# Patient Record
Sex: Female | Born: 1935 | Race: White | Hispanic: No | Marital: Single | Smoking: Never smoker
Health system: Southern US, Community
[De-identification: ages and names within clinical notes are randomized; demographics above are authoritative.]

## PROBLEM LIST (undated history)

## (undated) DIAGNOSIS — E785 Hyperlipidemia, unspecified: Secondary | ICD-10-CM

## (undated) DIAGNOSIS — I10 Essential (primary) hypertension: Secondary | ICD-10-CM

## (undated) DIAGNOSIS — I129 Hypertensive chronic kidney disease with stage 1 through stage 4 chronic kidney disease, or unspecified chronic kidney disease: Secondary | ICD-10-CM

## (undated) DIAGNOSIS — E039 Hypothyroidism, unspecified: Secondary | ICD-10-CM

## (undated) DIAGNOSIS — M810 Age-related osteoporosis without current pathological fracture: Secondary | ICD-10-CM

## (undated) DIAGNOSIS — I739 Peripheral vascular disease, unspecified: Secondary | ICD-10-CM

## (undated) HISTORY — DX: Hyperlipidemia, unspecified: E78.5

## (undated) HISTORY — PX: CLEFT PALATE REPAIR: SUR1165

## (undated) HISTORY — DX: Hypertensive chronic kidney disease with stage 1 through stage 4 chronic kidney disease, or unspecified chronic kidney disease: I12.9

## (undated) HISTORY — DX: Hypothyroidism, unspecified: E03.9

## (undated) HISTORY — DX: Age-related osteoporosis without current pathological fracture: M81.0

## (undated) HISTORY — DX: Essential (primary) hypertension: I10

## (undated) HISTORY — PX: ABDOMINAL HYSTERECTOMY: SHX81

## (undated) HISTORY — DX: Peripheral vascular disease, unspecified: I73.9

## (undated) NOTE — *Deleted (*Deleted)
Pt via EMS from home. Pt's niece was taking care of her. Pt has had uncontrollable diarrhea since yesterday. Pt denies pain. Pt is alert and oriented only to self. Per family, pt has had a decline for approx a month. NAD at this time.

---

## 2004-03-15 ENCOUNTER — Emergency Department: Payer: Self-pay | Admitting: Unknown Physician Specialty

## 2005-03-03 ENCOUNTER — Ambulatory Visit: Payer: Self-pay | Admitting: Ophthalmology

## 2005-03-10 ENCOUNTER — Ambulatory Visit: Payer: Self-pay | Admitting: Ophthalmology

## 2006-11-16 ENCOUNTER — Ambulatory Visit: Payer: Self-pay | Admitting: Ophthalmology

## 2006-11-16 ENCOUNTER — Other Ambulatory Visit: Payer: Self-pay

## 2006-11-23 ENCOUNTER — Ambulatory Visit: Payer: Self-pay | Admitting: Ophthalmology

## 2007-12-09 ENCOUNTER — Emergency Department: Payer: Self-pay | Admitting: Emergency Medicine

## 2011-05-24 ENCOUNTER — Emergency Department: Payer: Self-pay | Admitting: Emergency Medicine

## 2011-05-26 DIAGNOSIS — J309 Allergic rhinitis, unspecified: Secondary | ICD-10-CM | POA: Diagnosis not present

## 2011-05-26 DIAGNOSIS — S062X0A Diffuse traumatic brain injury without loss of consciousness, initial encounter: Secondary | ICD-10-CM | POA: Diagnosis not present

## 2011-05-26 DIAGNOSIS — I10 Essential (primary) hypertension: Secondary | ICD-10-CM | POA: Diagnosis not present

## 2011-05-26 LAB — HM COLONOSCOPY

## 2011-06-02 LAB — HM MAMMOGRAPHY

## 2012-03-08 DIAGNOSIS — I10 Essential (primary) hypertension: Secondary | ICD-10-CM | POA: Diagnosis not present

## 2012-03-08 DIAGNOSIS — Z23 Encounter for immunization: Secondary | ICD-10-CM | POA: Diagnosis not present

## 2012-03-08 DIAGNOSIS — E039 Hypothyroidism, unspecified: Secondary | ICD-10-CM | POA: Diagnosis not present

## 2012-04-05 DIAGNOSIS — I10 Essential (primary) hypertension: Secondary | ICD-10-CM | POA: Diagnosis not present

## 2012-04-05 DIAGNOSIS — E7800 Pure hypercholesterolemia, unspecified: Secondary | ICD-10-CM | POA: Diagnosis not present

## 2013-04-11 DIAGNOSIS — Z0000 Encounter for general adult medical examination without abnormal findings: Secondary | ICD-10-CM | POA: Diagnosis not present

## 2013-04-11 DIAGNOSIS — N183 Chronic kidney disease, stage 3 unspecified: Secondary | ICD-10-CM | POA: Diagnosis not present

## 2013-04-11 DIAGNOSIS — E785 Hyperlipidemia, unspecified: Secondary | ICD-10-CM | POA: Diagnosis not present

## 2013-05-31 ENCOUNTER — Ambulatory Visit: Payer: Self-pay

## 2013-11-18 DIAGNOSIS — E7800 Pure hypercholesterolemia, unspecified: Secondary | ICD-10-CM | POA: Diagnosis not present

## 2013-11-18 DIAGNOSIS — E039 Hypothyroidism, unspecified: Secondary | ICD-10-CM | POA: Diagnosis not present

## 2013-11-18 DIAGNOSIS — I10 Essential (primary) hypertension: Secondary | ICD-10-CM | POA: Diagnosis not present

## 2013-11-18 DIAGNOSIS — E785 Hyperlipidemia, unspecified: Secondary | ICD-10-CM | POA: Diagnosis not present

## 2014-12-15 ENCOUNTER — Telehealth: Payer: Self-pay | Admitting: Unknown Physician Specialty

## 2014-12-15 NOTE — Telephone Encounter (Signed)
E-fax came through for refill on: Rx: Telmisartan Copy in basket.

## 2014-12-15 NOTE — Telephone Encounter (Signed)
Tried to call patient because patient has not been seen since 11/2013 so they will need an appointment before this will be refilled.

## 2014-12-18 ENCOUNTER — Encounter: Payer: Self-pay | Admitting: Unknown Physician Specialty

## 2014-12-18 ENCOUNTER — Ambulatory Visit (INDEPENDENT_AMBULATORY_CARE_PROVIDER_SITE_OTHER): Payer: 59 | Admitting: Unknown Physician Specialty

## 2014-12-18 VITALS — BP 143/78 | HR 73 | Temp 98.3°F | Ht 62.0 in | Wt 194.4 lb

## 2014-12-18 DIAGNOSIS — I129 Hypertensive chronic kidney disease with stage 1 through stage 4 chronic kidney disease, or unspecified chronic kidney disease: Secondary | ICD-10-CM

## 2014-12-18 DIAGNOSIS — N182 Chronic kidney disease, stage 2 (mild): Secondary | ICD-10-CM

## 2014-12-18 DIAGNOSIS — N183 Chronic kidney disease, stage 3 unspecified: Secondary | ICD-10-CM

## 2014-12-18 DIAGNOSIS — N179 Acute kidney failure, unspecified: Secondary | ICD-10-CM | POA: Insufficient documentation

## 2014-12-18 DIAGNOSIS — E785 Hyperlipidemia, unspecified: Secondary | ICD-10-CM

## 2014-12-18 DIAGNOSIS — N189 Chronic kidney disease, unspecified: Secondary | ICD-10-CM

## 2014-12-18 DIAGNOSIS — N181 Chronic kidney disease, stage 1: Secondary | ICD-10-CM

## 2014-12-18 DIAGNOSIS — N184 Chronic kidney disease, stage 4 (severe): Secondary | ICD-10-CM | POA: Diagnosis not present

## 2014-12-18 DIAGNOSIS — N185 Chronic kidney disease, stage 5: Secondary | ICD-10-CM

## 2014-12-18 DIAGNOSIS — E039 Hypothyroidism, unspecified: Secondary | ICD-10-CM

## 2014-12-18 DIAGNOSIS — I739 Peripheral vascular disease, unspecified: Secondary | ICD-10-CM

## 2014-12-18 DIAGNOSIS — M810 Age-related osteoporosis without current pathological fracture: Secondary | ICD-10-CM

## 2014-12-18 LAB — MICROALBUMIN, URINE WAIVED
Creatinine, Urine Waived: 50 mg/dL (ref 10–300)
MICROALB, UR WAIVED: 10 mg/L (ref 0–19)
Microalb/Creat Ratio: <30 mg/g (ref <30)

## 2014-12-18 LAB — LIPID PANEL PICCOLO, WAIVED
Chol/HDL Ratio Piccolo,Waive: 5.4 mg/dL — ABNORMAL HIGH
Cholesterol Piccolo, Waived: 244 mg/dL — ABNORMAL HIGH (ref <200)
HDL Chol Piccolo, Waived: 45 mg/dL — ABNORMAL LOW (ref >59)
LDL CHOL CALC PICCOLO WAIVED: 174 mg/dL — AB (ref <100)
TRIGLYCERIDES PICCOLO,WAIVED: 124 mg/dL (ref <150)
VLDL CHOL CALC PICCOLO,WAIVE: 25 mg/dL (ref <30)

## 2014-12-18 MED ORDER — TELMISARTAN-HCTZ 80-25 MG PO TABS: 1.0000 | ORAL_TABLET | Freq: Every day | ORAL | Status: DC

## 2014-12-18 MED ORDER — LEVOTHYROXINE SODIUM 100 MCG PO TABS: 100.0000 ug | ORAL_TABLET | Freq: Every day | ORAL | Status: DC

## 2014-12-18 NOTE — Progress Notes (Signed)
BP 143/78 mmHg  Pulse 73  Temp(Src) 98.3 F (36.8 C)  Ht 5\' 2"  (1.575 m)  Wt 194 lb 6.4 oz (88.179 kg)  BMI 35.55 kg/m2  SpO2 98%  LMP  (LMP Unknown)   Subjective:    Patient ID: Kristin Juarez, female    DOB: 1935/12/26, 79 y.o.   MRN: [id number]  HPI: Kristin Juarez is a 79 y.o. female  Chief Complaint  Patient presents with  . Hypertension    pt needs refill on telmisartan  . Hypothyroidism  . Hyperlipidemia  . Constipation    pt states she has been having trouble with constipation for about 2 weeks now   Hypertension This is a chronic problem. The problem is controlled. Pertinent negatives include no anxiety, chest pain, orthopnea, palpitations or shortness of breath. There are no compliance problems.   Hyperlipidemia This is a chronic ("I just can't take any medications for this" because of side-effects.  We tried multiple different kinds) problem. Pertinent negatives include no chest pain or shortness of breath. Current antihyperlipidemic treatment includes diet change. The current treatment provides no improvement of lipids. Compliance problems include adherence to diet (Drinking soft drinks but trying to wean self off).   Constipation This is a new (Up to date on colonoscopy) problem. The current episode started 1 to 4 weeks ago. The patient is not on a high fiber diet. She does not exercise regularly. There has not been adequate water intake. Risk factors: hypothyroid. Treatments tried: Miralax which works.   Hypothyroid Taking thyroid medication but not daily.  States she takes it 3 times a week.  Feels it is too strong as she is too on edge.     Relevant past medical, surgical, family and social history reviewed and updated as indicated. Interim medical history since our last visit reviewed. Allergies and medications reviewed and updated.  Review of Systems  Respiratory: Negative for shortness of breath.   Cardiovascular: Negative for chest pain, palpitations  and orthopnea.  Gastrointestinal: Positive for constipation.    Per HPI unless specifically indicated above     Objective:    BP 143/78 mmHg  Pulse 73  Temp(Src) 98.3 F (36.8 C)  Ht 5\' 2"  (1.575 m)  Wt 194 lb 6.4 oz (88.179 kg)  BMI 35.55 kg/m2  SpO2 98%  LMP  (LMP Unknown)  Wt Readings from Last 3 Encounters:  12/18/14 194 lb 6.4 oz (88.179 kg)  11/18/13 199 lb (90.266 kg)    Physical Exam  Constitutional: She is oriented to person, place, and time. She appears well-developed and well-nourished. No distress.  HENT:  Head: Normocephalic and atraumatic.  Eyes: Conjunctivae and lids are normal. Right eye exhibits no discharge. Left eye exhibits no discharge. No scleral icterus.  Cardiovascular: Normal rate, regular rhythm and normal heart sounds.   Pulmonary/Chest: Effort normal and breath sounds normal. No respiratory distress.  Abdominal: Normal appearance and bowel sounds are normal. She exhibits no distension. There is no splenomegaly or hepatomegaly. There is no tenderness.  Musculoskeletal: Normal range of motion.  Neurological: She is alert and oriented to person, place, and time.  Skin: Skin is warm and intact. No rash noted. No pallor.  Psychiatric: She has a normal mood and affect. Her behavior is normal. Judgment and thought content normal.  Vitals reviewed.     Assessment & Plan:   Problem List Items Addressed This Visit      Unprioritized   Hyperlipidemia    Intolerant to all medication.  Let's recheck after thyroid is managed.        Relevant Medications   telmisartan-hydrochlorothiazide (MICARDIS HCT) 80-25 MG per tablet   Other Relevant Orders   Lipid Panel Piccolo, Waived   Hypertensive CKD (chronic kidney disease) - Primary    Stable.  Continue present medication.  Check CMP      Relevant Orders   Comprehensive metabolic panel   Microalbumin, Urine Waived   Uric acid   CKD (chronic kidney disease), stage III    Check CMP      Relevant  Orders   Comprehensive metabolic panel   Hypothyroidism    Feels dose is too strong and therefore not compliant.  Will restart at 100 mcgs.        Relevant Medications   levothyroxine (SYNTHROID, LEVOTHROID) 100 MCG tablet   Other Relevant Orders   TSH       Follow up plan: Return in about 3 months (around 03/20/2015).

## 2014-12-18 NOTE — Assessment & Plan Note (Signed)
Feels dose is too strong and therefore not compliant.  Will restart at 100 mcgs.

## 2014-12-18 NOTE — Assessment & Plan Note (Signed)
Intolerant to all medication.  Let's recheck after thyroid is managed.

## 2014-12-18 NOTE — Assessment & Plan Note (Signed)
Check CMP.  ?

## 2014-12-18 NOTE — Assessment & Plan Note (Signed)
Stable.  Continue present medication.  Check CMP

## 2014-12-18 NOTE — Telephone Encounter (Signed)
Patient is coming in for an appointment today

## 2014-12-19 LAB — COMPREHENSIVE METABOLIC PANEL
A/G RATIO: 1.5 (ref 1.1–2.5)
ALT: 42 IU/L — ABNORMAL HIGH (ref 0–32)
AST: 37 IU/L (ref 0–40)
Albumin: 4.1 g/dL (ref 3.5–4.8)
Alkaline Phosphatase: 107 IU/L (ref 39–117)
BUN/Creatinine Ratio: 21 (ref 11–26)
BUN: 27 mg/dL (ref 8–27)
Bilirubin Total: 0.5 mg/dL (ref 0.0–1.2)
CALCIUM: 9.8 mg/dL (ref 8.7–10.3)
CO2: 26 mmol/L (ref 18–29)
CREATININE: 1.3 mg/dL — AB (ref 0.57–1.00)
Chloride: 99 mmol/L (ref 97–108)
GFR, EST AFRICAN AMERICAN: 46 mL/min/{1.73_m2} — AB (ref >59)
GFR, EST NON AFRICAN AMERICAN: 40 mL/min/{1.73_m2} — AB (ref >59)
GLOBULIN, TOTAL: 2.7 g/dL (ref 1.5–4.5)
Glucose: 103 mg/dL — ABNORMAL HIGH (ref 65–99)
POTASSIUM: 4.3 mmol/L (ref 3.5–5.2)
SODIUM: 142 mmol/L (ref 134–144)
TOTAL PROTEIN: 6.8 g/dL (ref 6.0–8.5)

## 2014-12-19 LAB — URIC ACID: URIC ACID: 10.6 mg/dL — AB (ref 2.5–7.1)

## 2014-12-19 LAB — TSH: TSH: 8.13 u[IU]/mL — ABNORMAL HIGH (ref 0.450–4.500)

## 2014-12-20 ENCOUNTER — Encounter: Payer: Self-pay | Admitting: Unknown Physician Specialty

## 2014-12-26 ENCOUNTER — Other Ambulatory Visit: Payer: Self-pay | Admitting: Unknown Physician Specialty

## 2014-12-26 DIAGNOSIS — N183 Chronic kidney disease, stage 3 unspecified: Secondary | ICD-10-CM

## 2014-12-27 ENCOUNTER — Telehealth: Payer: Self-pay | Admitting: Unknown Physician Specialty

## 2014-12-27 NOTE — Telephone Encounter (Signed)
Routing to provider. This should have been put in under [employer] instead of [employer]. It is the same patient so I am going to have [facility] merge the 2 charts. The MRN for Faria is [id number] so please make sure it does under the right chart until we get the two merged.

## 2014-12-27 NOTE — Telephone Encounter (Signed)
PT CAME IN AFTER GETTING LETTER IN THE MAIL AND SAD THAT SHE WOULD LIKE TO GO AHEAD AND GET THE REFERRAL FOR THE KIDNEY DR.

## 2014-12-28 NOTE — Telephone Encounter (Signed)
Thank you.  I put the order in on Monday.

## 2014-12-28 NOTE — Telephone Encounter (Signed)
Called and let patient know that Kristin Juarez out in the referral.

## 2015-01-02 DIAGNOSIS — N183 Chronic kidney disease, stage 3 (moderate): Secondary | ICD-10-CM | POA: Diagnosis not present

## 2015-01-02 DIAGNOSIS — I10 Essential (primary) hypertension: Secondary | ICD-10-CM | POA: Diagnosis not present

## 2015-01-02 DIAGNOSIS — R609 Edema, unspecified: Secondary | ICD-10-CM | POA: Diagnosis not present

## 2015-01-05 ENCOUNTER — Telehealth: Payer: Self-pay

## 2015-01-05 NOTE — Telephone Encounter (Signed)
Patient called and wanted to know if you would increase the dose of her levothyroxine. Patient was last seen 12/18/14 and pharmacy is [facility].

## 2015-01-05 NOTE — Telephone Encounter (Signed)
No, because a higher dose will be too much according to her blood work

## 2015-01-09 NOTE — Telephone Encounter (Signed)
Called and let patient know that Elnita Maxwell said she was not increasing her synthroid dosage because it would be too much according to her blood work.

## 2015-02-26 DIAGNOSIS — R600 Localized edema: Secondary | ICD-10-CM | POA: Diagnosis not present

## 2015-02-26 DIAGNOSIS — N2889 Other specified disorders of kidney and ureter: Secondary | ICD-10-CM | POA: Diagnosis not present

## 2015-02-26 DIAGNOSIS — I10 Essential (primary) hypertension: Secondary | ICD-10-CM | POA: Diagnosis not present

## 2015-02-26 DIAGNOSIS — N183 Chronic kidney disease, stage 3 (moderate): Secondary | ICD-10-CM | POA: Diagnosis not present

## 2015-02-28 ENCOUNTER — Other Ambulatory Visit: Payer: Self-pay | Admitting: Nephrology

## 2015-02-28 DIAGNOSIS — N2889 Other specified disorders of kidney and ureter: Secondary | ICD-10-CM

## 2015-02-28 DIAGNOSIS — R601 Generalized edema: Secondary | ICD-10-CM

## 2015-02-28 DIAGNOSIS — R011 Cardiac murmur, unspecified: Secondary | ICD-10-CM

## 2015-03-15 ENCOUNTER — Ambulatory Visit
Admission: RE | Admit: 2015-03-15 | Discharge: 2015-03-15 | Disposition: A | Discharge status: Home / Self Care | Payer: Commercial Managed Care - HMO | Source: Ambulatory Visit | Attending: Nephrology | Admitting: Nephrology

## 2015-03-15 ENCOUNTER — Ambulatory Visit (HOSPITAL_BASED_OUTPATIENT_CLINIC_OR_DEPARTMENT_OTHER)
Admission: RE | Admit: 2015-03-15 | Discharge: 2015-03-15 | Disposition: A | Discharge status: Home / Self Care | Payer: Commercial Managed Care - HMO | Source: Ambulatory Visit | Attending: Nephrology | Admitting: Nephrology

## 2015-03-15 DIAGNOSIS — I071 Rheumatic tricuspid insufficiency: Secondary | ICD-10-CM | POA: Insufficient documentation

## 2015-03-15 DIAGNOSIS — N200 Calculus of kidney: Secondary | ICD-10-CM | POA: Insufficient documentation

## 2015-03-15 DIAGNOSIS — R011 Cardiac murmur, unspecified: Secondary | ICD-10-CM | POA: Insufficient documentation

## 2015-03-15 DIAGNOSIS — R601 Generalized edema: Secondary | ICD-10-CM | POA: Diagnosis not present

## 2015-03-15 DIAGNOSIS — K449 Diaphragmatic hernia without obstruction or gangrene: Secondary | ICD-10-CM | POA: Insufficient documentation

## 2015-03-15 DIAGNOSIS — I5189 Other ill-defined heart diseases: Secondary | ICD-10-CM | POA: Diagnosis not present

## 2015-03-15 DIAGNOSIS — K5730 Diverticulosis of large intestine without perforation or abscess without bleeding: Secondary | ICD-10-CM | POA: Insufficient documentation

## 2015-03-15 DIAGNOSIS — I728 Aneurysm of other specified arteries: Secondary | ICD-10-CM | POA: Insufficient documentation

## 2015-03-15 DIAGNOSIS — N2889 Other specified disorders of kidney and ureter: Secondary | ICD-10-CM

## 2015-03-15 NOTE — Progress Notes (Signed)
*  PRELIMINARY RESULTS* Echocardiogram 2D Echocardiogram has been performed.  Georgann HousekeeperJerry R Hege 03/15/2015, 9:17 AM

## 2015-03-19 ENCOUNTER — Ambulatory Visit (INDEPENDENT_AMBULATORY_CARE_PROVIDER_SITE_OTHER): Payer: 59 | Admitting: Unknown Physician Specialty

## 2015-03-19 ENCOUNTER — Encounter: Payer: Self-pay | Admitting: Unknown Physician Specialty

## 2015-03-19 VITALS — BP 149/79 | HR 69 | Temp 97.3°F | Ht 62.9 in | Wt 186.0 lb

## 2015-03-19 DIAGNOSIS — I129 Hypertensive chronic kidney disease with stage 1 through stage 4 chronic kidney disease, or unspecified chronic kidney disease: Secondary | ICD-10-CM

## 2015-03-19 DIAGNOSIS — E039 Hypothyroidism, unspecified: Secondary | ICD-10-CM

## 2015-03-19 DIAGNOSIS — Z23 Encounter for immunization: Secondary | ICD-10-CM | POA: Diagnosis not present

## 2015-03-19 DIAGNOSIS — E785 Hyperlipidemia, unspecified: Secondary | ICD-10-CM

## 2015-03-19 NOTE — Assessment & Plan Note (Signed)
Check TSH and adjust dose accordingly

## 2015-03-19 NOTE — Assessment & Plan Note (Signed)
Stable at home.

## 2015-03-19 NOTE — Assessment & Plan Note (Signed)
Check when euthyroid and pt aslo making significant lifestyle changes.

## 2015-03-19 NOTE — Progress Notes (Signed)
BP 149/79 mmHg  Pulse 69  Temp(Src) 97.3 F (36.3 C)  Ht 5' 2.9" (1.598 m)  Wt 186 lb (84.369 kg)  BMI 33.04 kg/m2  SpO2 97%  LMP  (LMP Unknown)   Subjective:    Patient ID: Kristin Juarez, female    DOB: [birth year], 79 y.o.   MRN: [id number]  HPI: Kristin Juarez is a 79 y.o. female  Chief Complaint  Patient presents with  . Hypertension  . Hypothyroidism  . Medication Refill    pt states she needs refill on all medications   Hypothyroid Reduced dose from 150 to 100 mcgs as she wasn't taking it due to feeling it was too high.  She feels the dose may be too low but feels weak.    Hypertension Using medications without difficulty Average home BPs 130's at home   No problems or lightheadedness No chest pain with exertion or shortness of breath No Edema   Hyperlipidemia Statin intolerant     Relevant past medical, surgical, family and social history reviewed and updated as indicated. Interim medical history since our last visit reviewed. Allergies and medications reviewed and updated.  Review of Systems  Per HPI unless specifically indicated above     Objective:    BP 149/79 mmHg  Pulse 69  Temp(Src) 97.3 F (36.3 C)  Ht 5' 2.9" (1.598 m)  Wt 186 lb (84.369 kg)  BMI 33.04 kg/m2  SpO2 97%  LMP  (LMP Unknown)  Wt Readings from Last 3 Encounters:  03/19/15 186 lb (84.369 kg)  12/18/14 194 lb 6.4 oz (88.179 kg)  11/18/13 199 lb (90.266 kg)    Physical Exam  Constitutional: She is oriented to person, place, and time. She appears well-developed and well-nourished. No distress.  HENT:  Head: Normocephalic and atraumatic.  Eyes: Conjunctivae and lids are normal. Right eye exhibits no discharge. Left eye exhibits no discharge. No scleral icterus.  Cardiovascular: Normal rate, regular rhythm and normal heart sounds.   Pulmonary/Chest: Effort normal and breath sounds normal. No respiratory distress.  Abdominal: Normal appearance and bowel sounds are normal.  She exhibits no distension. There is no splenomegaly or hepatomegaly. There is no tenderness.  Musculoskeletal: Normal range of motion.  Neurological: She is alert and oriented to person, place, and time.  Skin: Skin is intact. No rash noted. No pallor.  Psychiatric: She has a normal mood and affect. Her behavior is normal. Judgment and thought content normal.    Results for orders placed or performed in visit on 12/18/14  Comprehensive metabolic panel  Result Value Ref Range   Glucose 103 (H) 65 - 99 mg/dL   BUN 27 8 - 27 mg/dL   Creatinine, Ser 7.82 (H) 0.57 - 1.00 mg/dL   GFR calc non Af Amer 40 (L) >59 mL/min/1.73   GFR calc Af Amer 46 (L) >59 mL/min/1.73   BUN/Creatinine Ratio 21 11 - 26   Sodium 142 134 - 144 mmol/L   Potassium 4.3 3.5 - 5.2 mmol/L   Chloride 99 97 - 108 mmol/L   CO2 26 18 - 29 mmol/L   Calcium 9.8 8.7 - 10.3 mg/dL   Total Protein 6.8 6.0 - 8.5 g/dL   Albumin 4.1 3.5 - 4.8 g/dL   Globulin, Total 2.7 1.5 - 4.5 g/dL   Albumin/Globulin Ratio 1.5 1.1 - 2.5   Bilirubin Total 0.5 0.0 - 1.2 mg/dL   Alkaline Phosphatase 107 39 - 117 IU/L   AST 37 0 - 40 IU/L  ALT 42 (H) 0 - 32 IU/L  Lipid Panel Piccolo, Waived  Result Value Ref Range   Cholesterol Piccolo, Waived 244 (H) <200 mg/dL   HDL Chol Piccolo, Waived 45 (L) >59 mg/dL   Triglycerides Piccolo,Waived 124 <150 mg/dL   Chol/HDL Ratio Piccolo,Waive 5.4 (H) mg/dL   LDL Chol Calc Piccolo Waived 174 (H) <100 mg/dL   VLDL Chol Calc Piccolo,Waive 25 <30 mg/dL  Microalbumin, Urine Waived  Result Value Ref Range   Microalb, Ur Waived 10 0 - 19 mg/L   Creatinine, Urine Waived 50 10 - 300 mg/dL   Microalb/Creat Ratio <30 <30 mg/g  Uric acid  Result Value Ref Range   Uric Acid 10.6 (H) 2.5 - 7.1 mg/dL  TSH  Result Value Ref Range   TSH 8.130 (H) 0.450 - 4.500 uIU/mL      Assessment & Plan:   Problem List Items Addressed This Visit      Unprioritized   Hyperlipidemia    Check when euthyroid and pt aslo  making significant lifestyle changes.        Hypertensive CKD (chronic kidney disease)    Stable at home      Hypothyroidism    Check TSH and adjust dose accordingly      Relevant Orders   TSH    Other Visit Diagnoses    Immunization due    -  Primary    Relevant Orders    Flu Vaccine QUAD 36+ mos IM (Completed)        Follow up plan: No Follow-up on file.

## 2015-03-20 LAB — TSH: TSH: 0.147 u[IU]/mL — ABNORMAL LOW (ref 0.450–4.500)

## 2015-03-21 ENCOUNTER — Telehealth: Payer: Self-pay | Admitting: Unknown Physician Specialty

## 2015-03-21 MED ORDER — LEVOTHYROXINE SODIUM 75 MCG PO TABS: 75.0000 ug | ORAL_TABLET | Freq: Every day | ORAL | Status: DC

## 2015-03-21 NOTE — Telephone Encounter (Signed)
Called r/e thyroid dose too high.  Will decrease levothyroxine to 75 mcgs.  Recheck in 3 months

## 2015-06-25 ENCOUNTER — Ambulatory Visit: Payer: Self-pay | Admitting: Unknown Physician Specialty

## 2015-07-03 ENCOUNTER — Other Ambulatory Visit: Payer: Self-pay

## 2015-07-03 MED ORDER — LEVOTHYROXINE SODIUM 75 MCG PO TABS: 75.0000 ug | ORAL_TABLET | Freq: Every day | ORAL | Status: DC

## 2015-07-03 NOTE — Telephone Encounter (Signed)
Patient has appointment tomorrow. Pharmacy is [facility].

## 2015-07-04 ENCOUNTER — Ambulatory Visit: Payer: 59 | Admitting: Unknown Physician Specialty

## 2015-07-23 ENCOUNTER — Ambulatory Visit: Payer: 59 | Admitting: Unknown Physician Specialty

## 2015-09-10 ENCOUNTER — Other Ambulatory Visit: Payer: Self-pay

## 2015-09-10 MED ORDER — LEVOTHYROXINE SODIUM 75 MCG PO TABS: 75.0000 ug | ORAL_TABLET | Freq: Every day | ORAL | Status: DC

## 2015-09-10 NOTE — Telephone Encounter (Signed)
Patient was last seen 03/19/15 and pharmacy is [facility].

## 2015-10-05 ENCOUNTER — Other Ambulatory Visit: Payer: Self-pay | Admitting: Unknown Physician Specialty

## 2015-10-26 ENCOUNTER — Other Ambulatory Visit: Payer: Self-pay | Admitting: Unknown Physician Specialty

## 2015-10-26 NOTE — Telephone Encounter (Signed)
Tried to call patient on the home number but it just rang and no voicemail was available. So I tried to call the work number listed and there was no answer so I left a voicemail asking for patient to please return my call.

## 2015-10-26 NOTE — Telephone Encounter (Signed)
Needs TSH checked. 

## 2015-10-29 NOTE — Telephone Encounter (Signed)
Tried calling patient's home phone but it just rang and no voicemail was available. Tried calling the work number and there was no answer so I left a voicemail asking for patient to please return my call.

## 2015-10-30 NOTE — Telephone Encounter (Signed)
Tried calling patient's home number but it just rang. Tried work number and spoke to patient. I let her know that Elnita MaxwellCheryl wanted her to come in to have her thyroid checked. Patient stated that she would come by as soon as she could to have that done.

## 2015-11-23 ENCOUNTER — Other Ambulatory Visit: Payer: Self-pay | Admitting: Unknown Physician Specialty

## 2016-07-11 ENCOUNTER — Encounter: Payer: Self-pay | Admitting: Unknown Physician Specialty

## 2016-07-11 ENCOUNTER — Ambulatory Visit (INDEPENDENT_AMBULATORY_CARE_PROVIDER_SITE_OTHER): Payer: 59 | Admitting: Unknown Physician Specialty

## 2016-07-11 VITALS — BP 170/110 | HR 59 | Temp 97.7°F | Ht 62.3 in | Wt 190.7 lb

## 2016-07-11 DIAGNOSIS — Z0000 Encounter for general adult medical examination without abnormal findings: Secondary | ICD-10-CM

## 2016-07-11 DIAGNOSIS — Z23 Encounter for immunization: Secondary | ICD-10-CM

## 2016-07-11 DIAGNOSIS — Z7189 Other specified counseling: Secondary | ICD-10-CM

## 2016-07-11 DIAGNOSIS — I129 Hypertensive chronic kidney disease with stage 1 through stage 4 chronic kidney disease, or unspecified chronic kidney disease: Secondary | ICD-10-CM | POA: Diagnosis not present

## 2016-07-11 DIAGNOSIS — E039 Hypothyroidism, unspecified: Secondary | ICD-10-CM | POA: Diagnosis not present

## 2016-07-11 MED ORDER — AMLODIPINE BESYLATE 5 MG PO TABS: 5.0000 mg | ORAL_TABLET | Freq: Every day | ORAL | 3 refills | Status: DC

## 2016-07-11 MED ORDER — SYNTHROID 75 MCG PO TABS: 75.0000 ug | ORAL_TABLET | Freq: Every day | ORAL | 3 refills | Status: DC

## 2016-07-11 MED ORDER — TELMISARTAN-HCTZ 80-25 MG PO TABS: 1.0000 | ORAL_TABLET | Freq: Every day | ORAL | 1 refills | Status: DC

## 2016-07-11 NOTE — Assessment & Plan Note (Signed)
Lost to follow up.  Will restart meds and recheck TSH

## 2016-07-11 NOTE — Progress Notes (Signed)
BP (!) 170/110   Pulse (!) 59   Temp 97.7 F (36.5 C)   Ht 5' 2.3" (1.582 m)   Wt 190 lb 11.2 oz (86.5 kg)   LMP  (LMP Unknown)   SpO2 96%   BMI 34.54 kg/m    Subjective:    Patient ID: Kristin Juarez, female    DOB: 07/07/1935, 81 y.o.   MRN: [id number]  HPI: Kristin Juarez is a 81 y.o. female  Chief Complaint  Patient presents with  . Medicare Wellness   Functional Status Survey: Is the patient deaf or have difficulty hearing?: No Does the patient have difficulty seeing, even when wearing glasses/contacts?: No Does the patient have difficulty concentrating, remembering, or making decisions?: No Does the patient have difficulty walking or climbing stairs?: No Does the patient have difficulty dressing or bathing?: No Does the patient have difficulty doing errands alone such as visiting a doctor's office or shopping?: No  Fall Risk  07/11/2016 12/18/2014  Falls in the past year? No No   [facility] 07/11/2016 12/18/2014  Decreased Interest 0 1  Down, Depressed, Hopeless 0 0  PHQ - 2 Score 0 1  Altered sleeping 0 -  Tired, decreased energy 1 -  Change in appetite 0 -  Feeling bad or failure about yourself  0 -  Trouble concentrating 0 -  Moving slowly or fidgety/restless 0 -  Suicidal thoughts 0 -  PHQ-9 Score 1 -   Hypertension Using medications without difficulty Average home BPs 133/78 about 2 months ago  No problems or lightheadedness No chest pain with exertion or shortness of breath No Edema  Hypothyroid Off of her Synthroid for a couple of months.  She is fatigued.    States she is on administrative leave and needs to be "checked out" to see if she can come back to work.    Relevant past medical, surgical, family and social history reviewed and updated as indicated. Interim medical history since our last visit reviewed. Allergies and medications reviewed and updated.  Review of Systems  Constitutional: Negative.   HENT:       Poor  hearing.  States she is on administrative leave due to this.  States she need a hearing aid  Eyes: Negative.   Respiratory: Negative.   Cardiovascular: Negative.   Gastrointestinal: Negative.   Genitourinary: Negative.   Musculoskeletal: Negative.   Skin: Negative.   Neurological: Negative.   Psychiatric/Behavioral: Positive for agitation.    Per HPI unless specifically indicated above     Objective:    BP (!) 170/110   Pulse (!) 59   Temp 97.7 F (36.5 C)   Ht 5' 2.3" (1.582 m)   Wt 190 lb 11.2 oz (86.5 kg)   LMP  (LMP Unknown)   SpO2 96%   BMI 34.54 kg/m   Wt Readings from Last 3 Encounters:  07/11/16 190 lb 11.2 oz (86.5 kg)  03/19/15 186 lb (84.4 kg)  12/18/14 194 lb 6.4 oz (88.2 kg)    Physical Exam  Constitutional: She is oriented to person, place, and time. She appears well-developed and well-nourished.  HENT:  Head: Normocephalic and atraumatic.  Eyes: Pupils are equal, round, and reactive to light. Right eye exhibits no discharge. Left eye exhibits no discharge. No scleral icterus.  Neck: Normal range of motion. Neck supple. Carotid bruit is not present. No thyromegaly present.  Cardiovascular: Normal rate, regular rhythm and normal heart sounds.  Exam reveals no gallop and no  friction rub.   No murmur heard. Pulmonary/Chest: Effort normal and breath sounds normal. No respiratory distress. She has no wheezes. She has no rales.  Abdominal: Soft. Bowel sounds are normal. There is no tenderness. There is no rebound.  Genitourinary: No breast swelling, tenderness or discharge.  Musculoskeletal: Normal range of motion.  Lymphadenopathy:    She has no cervical adenopathy.  Neurological: She is alert and oriented to person, place, and time.  Skin: Skin is warm, dry and intact. No rash noted.  Psychiatric: She has a normal mood and affect. Her speech is normal and behavior is normal. Judgment and thought content normal. Cognition and memory are normal.        Assessment & Plan:   Problem List Items Addressed This Visit      Unprioritized   Advanced care planning/counseling discussion    A voluntary discussion about advance care planning including the explanation and discussion of advance directives was extensively discussed  with the patient.  Explanation about the health care proxy and Living will was reviewed and packet with forms with explanation of how to fill them out was given.  During this discussion, the patient was able to identify a health care proxy as her niece and plans to fill out the paperwork required.  Patient was offered a separate Advance Care Planning visit for further assistance with forms.  She also elected to be a DNR.         Hypertensive CKD (chronic kidney disease)    Poor control today.  Add Amlodipine      Relevant Orders   Comprehensive metabolic panel   Lipid Panel w/o Chol/HDL Ratio   CBC with Differential/Platelet   Hypothyroidism    Lost to follow up.  Will restart meds and recheck TSH      Relevant Orders   TSH    Other Visit Diagnoses    Need for influenza vaccination    -  Primary   Relevant Orders   Flu vaccine HIGH DOSE PF (Completed)   Annual physical exam          I see no reason why patient cannot return to work  Follow up plan: Return in about 4 weeks (around 08/08/2016).

## 2016-07-11 NOTE — Assessment & Plan Note (Addendum)
Poor control today.  Add Amlodipine

## 2016-07-11 NOTE — Addendum Note (Signed)
Addended by: Gabriel CirriWICKER, Tavion Senkbeil on: 07/11/2016 03:34 PM   Modules accepted: Orders

## 2016-07-11 NOTE — Patient Instructions (Addendum)

## 2016-07-11 NOTE — Assessment & Plan Note (Signed)
A voluntary discussion about advance care planning including the explanation and discussion of advance directives was extensively discussed  with the patient.  Explanation about the health care proxy and Living will was reviewed and packet with forms with explanation of how to fill them out was given.  During this discussion, the patient was able to identify a health care proxy as her niece and plans to fill out the paperwork required.  Patient was offered a separate Advance Care Planning visit for further assistance with forms.  She also elected to be a DNR.

## 2016-07-12 LAB — COMPREHENSIVE METABOLIC PANEL
A/G RATIO: 1.6 (ref 1.2–2.2)
ALBUMIN: 4.6 g/dL (ref 3.5–4.8)
ALK PHOS: 81 IU/L (ref 39–117)
ALT: 19 IU/L (ref 0–32)
AST: 25 IU/L (ref 0–40)
BILIRUBIN TOTAL: 0.6 mg/dL (ref 0.0–1.2)
BUN / CREAT RATIO: 20 (ref 12–28)
BUN: 25 mg/dL (ref 8–27)
CHLORIDE: 99 mmol/L (ref 96–106)
CO2: 25 mmol/L (ref 18–29)
Calcium: 9.9 mg/dL (ref 8.7–10.3)
Creatinine, Ser: 1.25 mg/dL — ABNORMAL HIGH (ref 0.57–1.00)
GFR calc non Af Amer: 41 mL/min/{1.73_m2} — ABNORMAL LOW (ref >59)
GFR, EST AFRICAN AMERICAN: 48 mL/min/{1.73_m2} — AB (ref >59)
GLOBULIN, TOTAL: 2.9 g/dL (ref 1.5–4.5)
GLUCOSE: 93 mg/dL (ref 65–99)
POTASSIUM: 4 mmol/L (ref 3.5–5.2)
SODIUM: 142 mmol/L (ref 134–144)
TOTAL PROTEIN: 7.5 g/dL (ref 6.0–8.5)

## 2016-07-12 LAB — CBC WITH DIFFERENTIAL/PLATELET
BASOS: 1 %
Basophils Absolute: 0 10*3/uL (ref 0.0–0.2)
EOS (ABSOLUTE): 0.3 10*3/uL (ref 0.0–0.4)
Eos: 6 %
HEMATOCRIT: 40.4 % (ref 34.0–46.6)
Hemoglobin: 13.5 g/dL (ref 11.1–15.9)
IMMATURE GRANS (ABS): 0 10*3/uL (ref 0.0–0.1)
Immature Granulocytes: 0 %
LYMPHS: 30 %
Lymphocytes Absolute: 1.8 10*3/uL (ref 0.7–3.1)
MCH: 30 pg (ref 26.6–33.0)
MCHC: 33.4 g/dL (ref 31.5–35.7)
MCV: 90 fL (ref 79–97)
MONOS ABS: 0.4 10*3/uL (ref 0.1–0.9)
Monocytes: 6 %
Neutrophils Absolute: 3.4 10*3/uL (ref 1.4–7.0)
Neutrophils: 57 %
PLATELETS: 215 10*3/uL (ref 150–379)
RBC: 4.5 x10E6/uL (ref 3.77–5.28)
RDW: 14.1 % (ref 12.3–15.4)
WBC: 5.9 10*3/uL (ref 3.4–10.8)

## 2016-07-12 LAB — LIPID PANEL W/O CHOL/HDL RATIO
CHOLESTEROL TOTAL: 330 mg/dL — AB (ref 100–199)
HDL: 54 mg/dL (ref >39)
LDL Calculated: 251 mg/dL — ABNORMAL HIGH (ref 0–99)
Triglycerides: 125 mg/dL (ref 0–149)
VLDL CHOLESTEROL CAL: 25 mg/dL (ref 5–40)

## 2016-07-12 LAB — TSH: TSH: 41.68 u[IU]/mL — ABNORMAL HIGH (ref 0.450–4.500)

## 2016-07-15 ENCOUNTER — Telehealth: Payer: Self-pay | Admitting: Unknown Physician Specialty

## 2016-07-15 NOTE — Telephone Encounter (Signed)
Note was given to patient.

## 2016-07-15 NOTE — Telephone Encounter (Signed)
Patient had a physical last week with Kristin Juarez and needs a note saying she is cleared to work. She needs this ASAP.  She is in the office right now waiting for Kristin Juarez to type this.  Please advise.

## 2016-07-15 NOTE — Telephone Encounter (Signed)
Routing to provider, is it OK for note?

## 2016-08-12 ENCOUNTER — Ambulatory Visit: Payer: 59 | Admitting: Unknown Physician Specialty

## 2017-01-11 IMAGING — CT CT ABDOMEN W/O CM
2 of 4 series · 16 of 46 positions shown, 18 images · non-contrast
Comparison: None;

CLINICAL DATA: Abnormal ultrasound demonstrating a RIGHT renal
mass, lower abdominal pain for 1 month

EXAM:
CT ABDOMEN WITHOUT CONTRAST
TECHNIQUE: Multidetector CT imaging of the abdomen was performed following the
standard protocol without IV contrast. IV contrast not utilized due
to renal dysfunction. Sagittal and coronal MPR images reconstructed
from axial data set. Imaging of the pelvis was not ordered/was not
performed.

[Series 2: routine abd pel wo · axial · 0.78mm/px · z∈[-704,-464]mm · 13 of 54 slices shown, 15 images]
[im 3/54  soft-tissue]
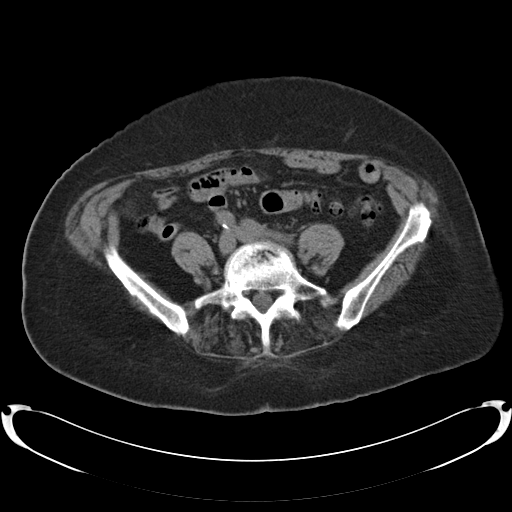
[im 3/54  bone]
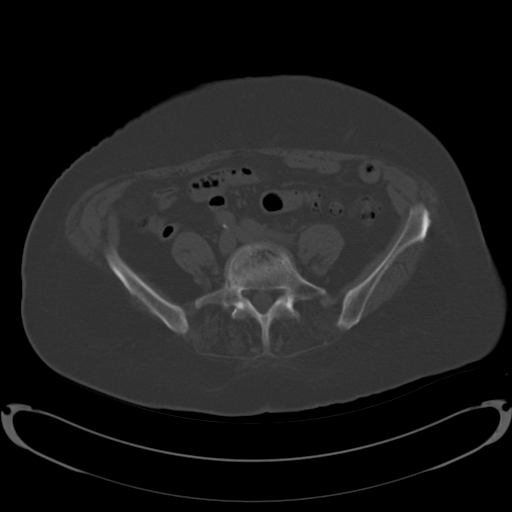
[im 8/54  soft-tissue]
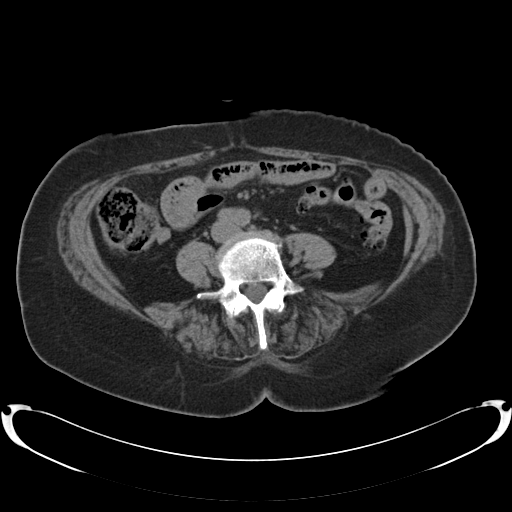
[im 11/54  soft-tissue]
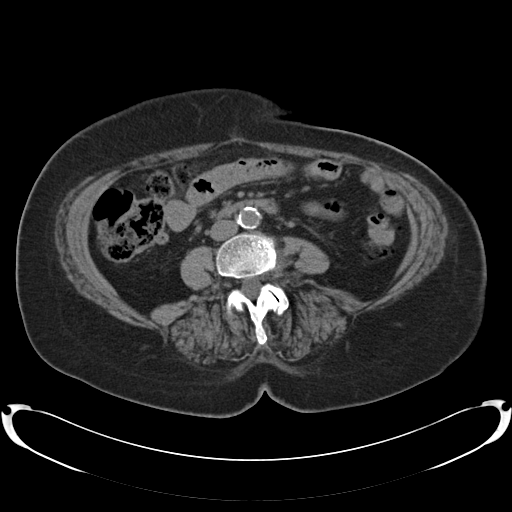
[im 16/54  soft-tissue]
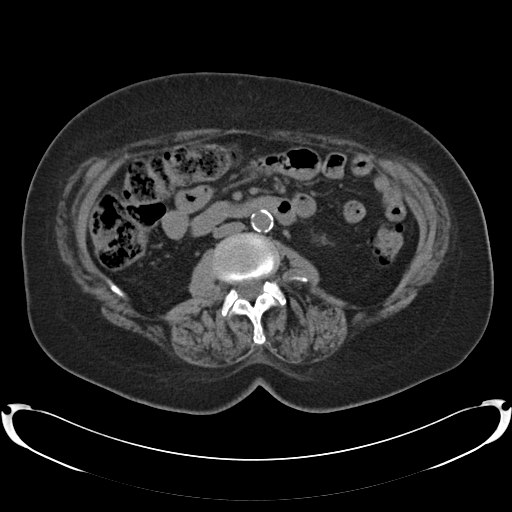
[im 19/54  soft-tissue]
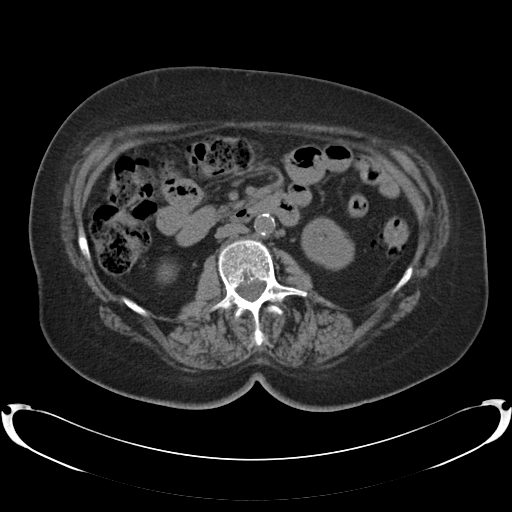
[im 24/54  soft-tissue]
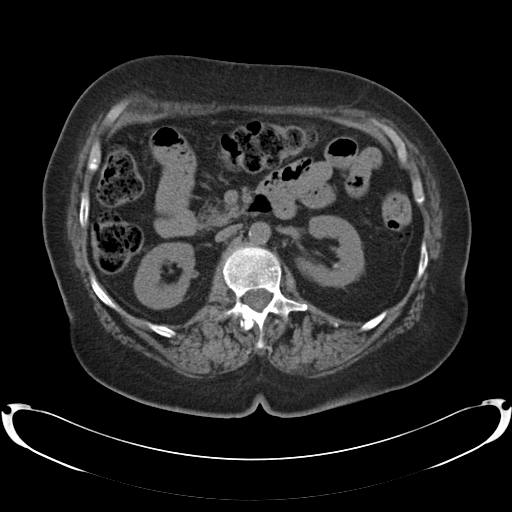
[im 27/54  soft-tissue]
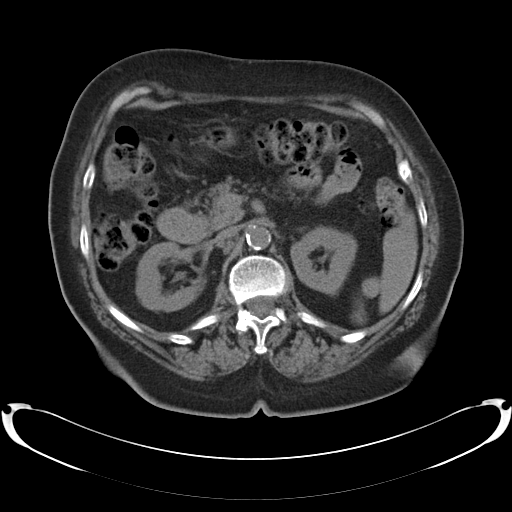
[im 30/54  soft-tissue]
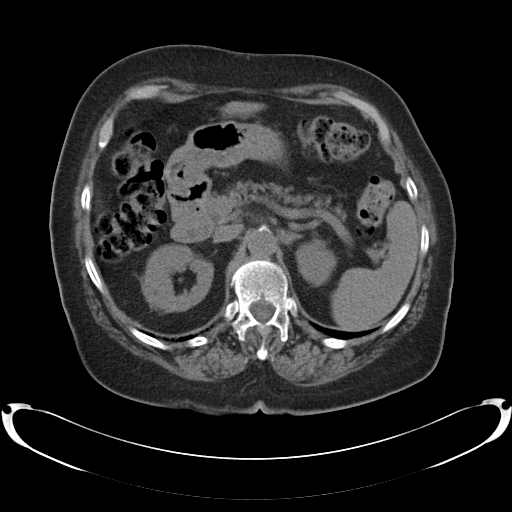
[im 35/54  soft-tissue]
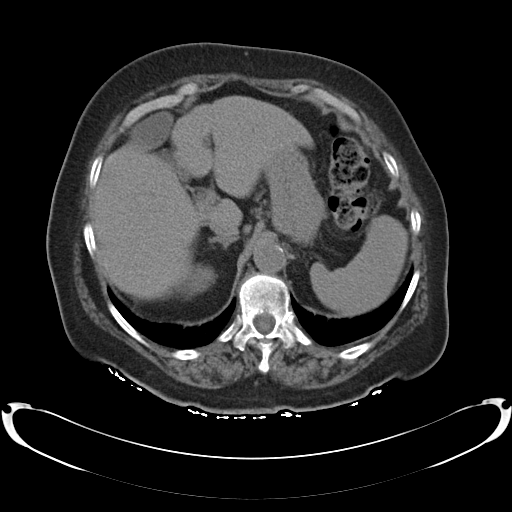
[im 35/54  bone]
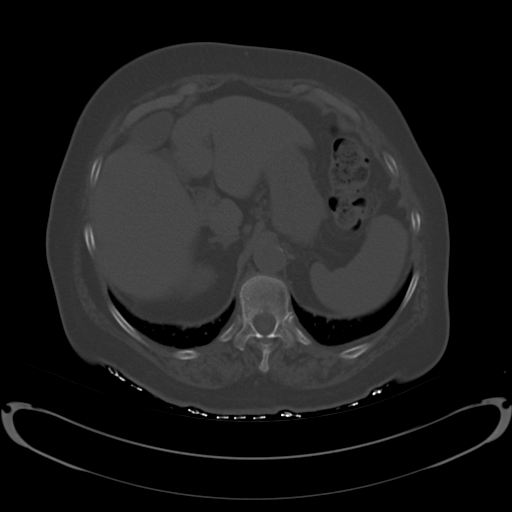
[im 38/54  soft-tissue]
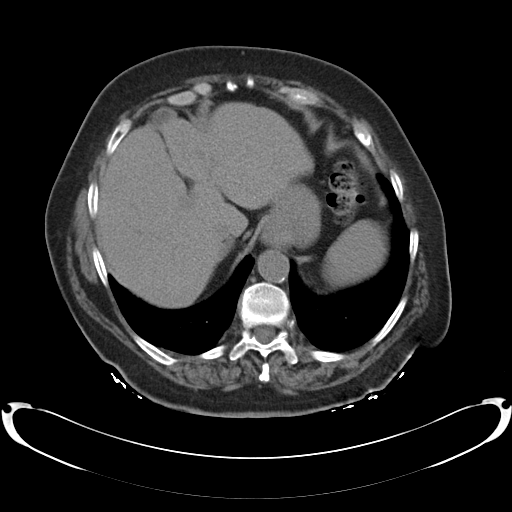
[im 43/54  soft-tissue]
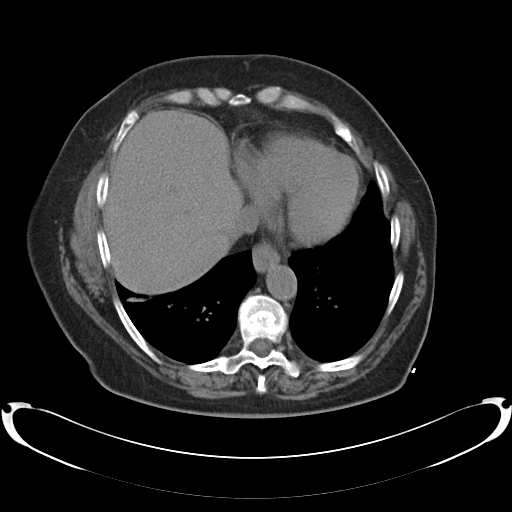
[im 46/54  soft-tissue]
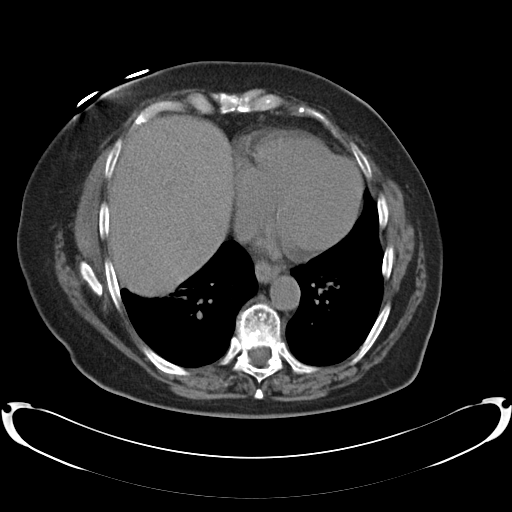
[im 51/54  soft-tissue]
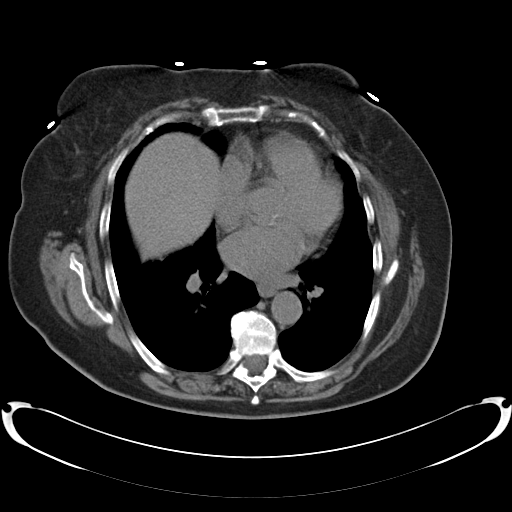

[Series 5: cor routine abd pel wo · coronal · 0.54mm/px · 3 of 141 slices shown]
[im 47/141  soft-tissue]
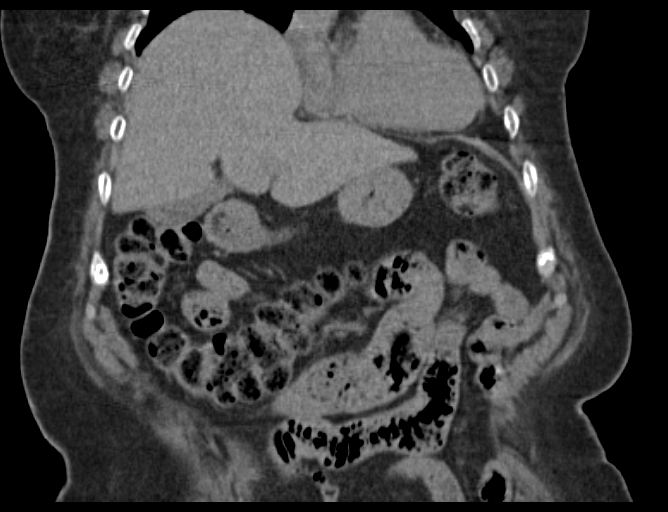
[im 63/141  soft-tissue]
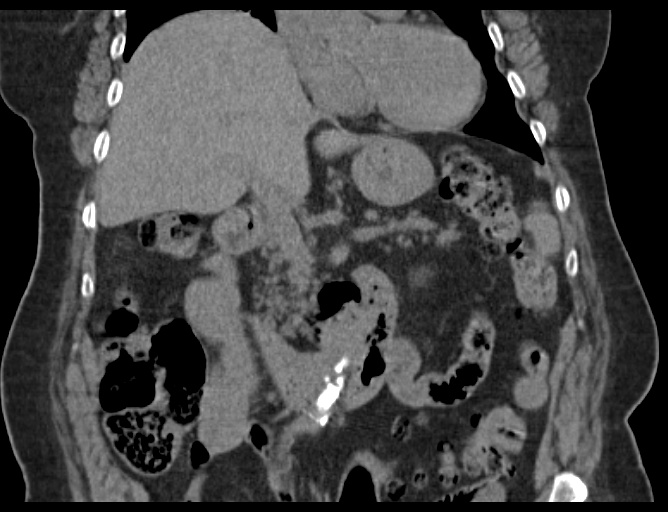
[im 78/141  soft-tissue]
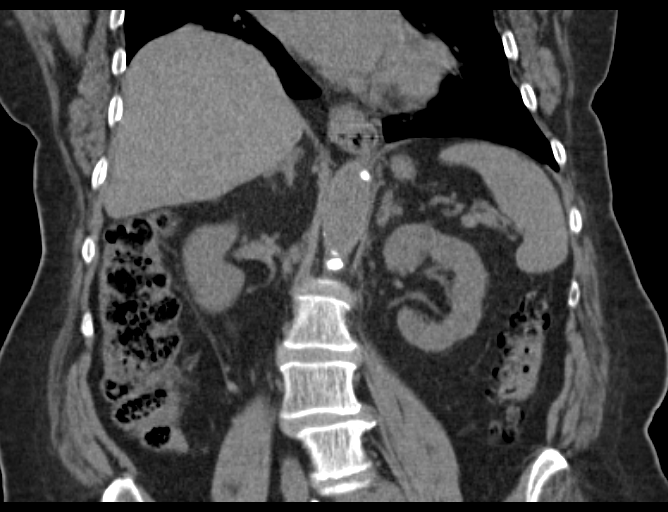

[16 of 46 positions shown; findings below may reference images not displayed]

correlation made with the report from a renal
ultrasound of 01/10/2015 but images from the prior ultrasound were
not provided for comparison.
FINDINGS: Linear subsegmental atelectasis RIGHT lower lobe.

5 mm fat attenuation focus mid RIGHT kidney question tiny
angiomyolipoma.

This does not correspond in size to the described 8 x 11 x 8 mm
echogenic focus at the interpolar region described on the ultrasound
report.

Tiny nonobstructing LEFT renal calculi.

No definite additional renal pathology is identified.

Dependent density in gallbladder cannot exclude tiny calculi.

Liver, spleen, pancreas, kidneys, and adrenal glands otherwise
grossly unremarkable for technique.

Scattered atherosclerotic calcifications with a calcified splenic
artery aneurysm 17 x 13 x 13 mm image 23.

Small hiatal hernia, remainder stomach decompressed and suboptimally
assessed.

Diverticulosis of descending and proximal sigmoid colon.

Remaining visualized bowel loops unremarkable.

No additional mass,
IMPRESSION: Tiny nonobstructing LEFT renal calculi.

Question cholelithiasis.

Small hiatal hernia.

Calcified splenic artery aneurysm.

Distal colonic diverticulosis.

5 mm fat attenuation focus within RIGHT kidney question
angiomyolipoma ; this is smaller than the described ultrasound
finding reported at 11 mm greatest size, uncertain if this
represents the same lesion.

No additional RIGHT renal pathology definitely visualized and
therefore recommend further imaging by noncontrast MR to assess for
additional RIGHT renal mass occult by noncontrast CT.

## 2017-08-07 ENCOUNTER — Other Ambulatory Visit: Payer: Self-pay | Admitting: Unknown Physician Specialty

## 2017-12-01 ENCOUNTER — Encounter: Payer: Self-pay | Admitting: Family Medicine

## 2017-12-01 ENCOUNTER — Ambulatory Visit: Payer: 59 | Admitting: Family Medicine

## 2017-12-01 VITALS — BP 205/110 | HR 75 | Temp 98.3°F | Resp 16 | Ht 62.0 in | Wt 185.8 lb

## 2017-12-01 DIAGNOSIS — I10 Essential (primary) hypertension: Secondary | ICD-10-CM

## 2017-12-01 DIAGNOSIS — E7800 Pure hypercholesterolemia, unspecified: Secondary | ICD-10-CM

## 2017-12-01 DIAGNOSIS — E559 Vitamin D deficiency, unspecified: Secondary | ICD-10-CM

## 2017-12-01 DIAGNOSIS — Z9119 Patient's noncompliance with other medical treatment and regimen: Secondary | ICD-10-CM

## 2017-12-01 DIAGNOSIS — I129 Hypertensive chronic kidney disease with stage 1 through stage 4 chronic kidney disease, or unspecified chronic kidney disease: Secondary | ICD-10-CM

## 2017-12-01 DIAGNOSIS — Z91199 Patient's noncompliance with other medical treatment and regimen due to unspecified reason: Secondary | ICD-10-CM

## 2017-12-01 DIAGNOSIS — H26499 Other secondary cataract, unspecified eye: Secondary | ICD-10-CM | POA: Diagnosis not present

## 2017-12-01 DIAGNOSIS — I739 Peripheral vascular disease, unspecified: Secondary | ICD-10-CM

## 2017-12-01 DIAGNOSIS — E039 Hypothyroidism, unspecified: Secondary | ICD-10-CM | POA: Diagnosis not present

## 2017-12-01 DIAGNOSIS — Z961 Presence of intraocular lens: Secondary | ICD-10-CM | POA: Diagnosis not present

## 2017-12-01 DIAGNOSIS — F322 Major depressive disorder, single episode, severe without psychotic features: Secondary | ICD-10-CM | POA: Insufficient documentation

## 2017-12-01 DIAGNOSIS — M810 Age-related osteoporosis without current pathological fracture: Secondary | ICD-10-CM

## 2017-12-01 DIAGNOSIS — D692 Other nonthrombocytopenic purpura: Secondary | ICD-10-CM

## 2017-12-01 DIAGNOSIS — N183 Chronic kidney disease, stage 3 (moderate): Secondary | ICD-10-CM | POA: Diagnosis not present

## 2017-12-01 DIAGNOSIS — R5383 Other fatigue: Secondary | ICD-10-CM

## 2017-12-01 MED ORDER — TELMISARTAN-HCTZ 80-25 MG PO TABS: 1.0000 | ORAL_TABLET | Freq: Every day | ORAL | 1 refills | Status: DC

## 2017-12-01 MED ORDER — AMLODIPINE BESYLATE 5 MG PO TABS: 5.0000 mg | ORAL_TABLET | Freq: Every day | ORAL | 1 refills | Status: DC

## 2017-12-01 MED ORDER — ROSUVASTATIN CALCIUM 40 MG PO TABS: 40.0000 mg | ORAL_TABLET | Freq: Every day | ORAL | 1 refills | Status: DC

## 2017-12-01 MED ORDER — SYNTHROID 75 MCG PO TABS: 75.0000 ug | ORAL_TABLET | Freq: Every day | ORAL | 1 refills | Status: DC

## 2017-12-01 NOTE — Progress Notes (Signed)
Name: Kristin Juarez   MRN: [id number]    DOB: 01-16-1936   Date:12/01/2017       Progress Note  Subjective  Chief Complaint  Chief Complaint  Patient presents with  . Establish Care    Still working at [employer] too many BC's  . Hypertension    Seen Dr. Jamesetta Orleans before but left and has not had medication any since June 2018. Having headaches, edema.  . Hypothyroidism    HPI  She works at [employer], and failed a CPR test. , they have noticed fatigue and advised her to be seen to be evaluated for weakness  Uncontrolled HTN and hypertensive kidney disease stage III: used to see Dr. Thedore Mins however stopped going because of cost. She denies pruritis , she has normal urine output. She denies chest pain or palpitation   Major depression severe episode acute: no previous history of depression, but she states her life is her job and she is afraid that she will lose her job if she does not start to feel better. She is feeling tired and it is the first time she could not pass her CPR test on her own, but got 100% on written test.   Hypothyroidism: she has a long history of hypothyroidism since thyroidectomy for unknown cause. She has not been taking medication. She has dry skin, she denies constipation but has been feeling tired  PVD: she does not recall seeing vascular surgeon, has edema legs, she denies claudication, she does not want to see vascular surgeon at this time.   Hyperlipidemia: discussed importance of starting statin therapy and she agrees.   Osteoporosis: we will check and refer for bone density test.    Patient Active Problem List   Diagnosis Date Noted  . Severe major depression, single episode, without psychotic features (HCC) 12/01/2017  . Advanced care planning/counseling discussion 07/11/2016  . PVD (peripheral vascular disease) (HCC) 12/18/2014  . Hyperlipidemia 12/18/2014  . Osteoporosis 12/18/2014  . Hypertensive CKD (chronic kidney disease) 12/18/2014  .  CKD (chronic kidney disease), stage III (HCC) 12/18/2014  . Hypothyroidism 12/18/2014    Past Surgical History:  Procedure Laterality Date  . ABDOMINAL HYSTERECTOMY    . CLEFT PALATE REPAIR      Family History  Problem Relation Age of Onset  . Heart disease Mother   . Heart attack Mother   . Heart disease Father   . Stroke Father   . Alcohol abuse Brother     Social History   Socioeconomic History  . Marital status: Single    Spouse name: Not on file  . Number of children: 0  . Years of education: Not on file  . Highest education level: Bachelor's degree (e.g., BA, AB, BS)  Occupational History  . Not on file  Social Needs  . Financial resource strain: Not hard at all  . Food insecurity:    Worry: Never true    Inability: Never true  . Transportation needs:    Medical: No    Non-medical: No  Tobacco Use  . Smoking status: Never Smoker  . Smokeless tobacco: Never Used  Substance and Sexual Activity  . Alcohol use: No    Alcohol/week: 0.0 oz  . Drug use: No  . Sexual activity: Never  Lifestyle  . Physical activity:    Days per week: 7 days    Minutes per session: Not on file  . Stress: Not at all  Relationships  . Social connections:    Talks  on phone: Once a week    Gets together: Never    Attends religious service: Never    Active member of club or organization: No    Attends meetings of clubs or organizations: Never    Relationship status: Never married  . Intimate partner violence:    Fear of current or ex partner: No    Emotionally abused: No    Physically abused: No    Forced sexual activity: No  Other Topics Concern  . Not on file  Social History Narrative  . Not on file     Current Outpatient Medications:  .  amLODipine (NORVASC) 5 MG tablet, Take 1 tablet (5 mg total) by mouth daily., Disp: 30 tablet, Rfl: 1 .  SYNTHROID 75 MCG tablet, Take 1 tablet (75 mcg total) by mouth daily before breakfast., Disp: 30 tablet, Rfl: 1 .   telmisartan-hydrochlorothiazide (MICARDIS HCT) 80-25 MG tablet, Take 1 tablet by mouth daily., Disp: 30 tablet, Rfl: 1  Allergies  Allergen Reactions  . Cozaar [Losartan Potassium] Other (See Comments)    Gas   . Crestor [Rosuvastatin Calcium] Other (See Comments)    gas  . Lipitor [Atorvastatin] Other (See Comments)    "full of water"  . Zocor [Simvastatin]      ROS  Constitutional: Negative for fever, positive for  weight change.  Respiratory: Negative for cough and shortness of breath.   Cardiovascular: Negative for chest pain or palpitations.  Gastrointestinal: Negative for abdominal pain, no bowel changes.  Musculoskeletal: Negative for gait problem or joint swelling.  Skin: Negative for rash.  Neurological: Negative for dizziness or headache.  No other specific complaints in a complete review of systems (except as listed in HPI above).  Objective  Vitals:   12/01/17 1110  BP: (!) 205/110  Pulse: 75  Resp: 16  Temp: 98.3 F (36.8 C)  TempSrc: Oral  SpO2: 95%  Weight: 185 lb 12.8 oz (84.3 kg)  Height: 5\' 2"  (1.575 m)    Body mass index is 33.98 kg/m.  Physical Exam  Constitutional: Patient appears well-developed and well-nourished. Obese  No distress.  HEENT: head atraumatic, normocephalic, pupils equal and reactive to light, neck supple, throat within normal limits Cardiovascular: Normal rate, regular rhythm and normal heart sounds.  No murmur heard. non pitting  BLE edema. Pulmonary/Chest: Effort normal and breath sounds normal. No respiratory distress. Abdominal: Soft.  There is no tenderness. Skin: senile purpura  Psychiatric: Patient has a normal mood and affect. behavior is normal. Judgment and thought content normal.  PHQ2/9: Depression screen [facility] 2/9 12/01/2017 07/11/2016 12/18/2014  Decreased Interest 3 0 1  Down, Depressed, Hopeless 2 0 0  PHQ - 2 Score 5 0 1  Altered sleeping 3 0 -  Tired, decreased energy 3 1 -  Change in appetite 2 0 -   Feeling bad or failure about yourself  1 0 -  Trouble concentrating 2 0 -  Moving slowly or fidgety/restless 2 0 -  Suicidal thoughts 2 0 -  PHQ-9 Score 20 1 -  Difficult doing work/chores Somewhat difficult - -     Fall Risk: Fall Risk  07/11/2016 12/18/2014  Falls in the past year? No No     Functional Status Survey: Is the patient deaf or have difficulty hearing?: Yes(right ear) Does the patient have difficulty seeing, even when wearing glasses/contacts?: Yes(glasses) Does the patient have difficulty concentrating, remembering, or making decisions?: No Does the patient have difficulty walking or climbing stairs?: Yes Does  the patient have difficulty dressing or bathing?: No Does the patient have difficulty doing errands alone such as visiting a doctor's office or shopping?: No    Assessment & Plan  1. Hypertensive kidney disease with stage 3 chronic kidney disease (HCC)  She needs to resume medications, increase compliance and monitor  - EKG 12-Lead - COMPLETE METABOLIC PANEL WITH GFR - Urine Microalbumin w/creat. Ratio - amLODipine (NORVASC) 5 MG tablet; Take 1 tablet (5 mg total) by mouth daily.  Dispense: 30 tablet; Refill: 1 - telmisartan-hydrochlorothiazide (MICARDIS HCT) 80-25 MG tablet; Take 1 tablet by mouth daily.  Dispense: 30 tablet; Refill: 1   2. PVD (peripheral vascular disease) (HCC)  Discussed referral to vascular surgeon but she wants to hold off for now because of cost  3. Hypothyroidism, unspecified type  - TSH  4. Pure hypercholesterolemia  - Lipid panel She is willing to try taking Crestor again   5. Vitamin D deficiency  Check vitamin D level   6. Severe major depression, single episode, without psychotic features (HCC)  We will treat thyroid and recheck in 6 weeks with another TSH  7. Age-related osteoporosis without current pathological fracture  - DG Bone Density; Future - VITAMIN D 25 Hydroxy (Vit-D Deficiency, Fractures) -  Parathyroid hormone, intact (no Ca)  8. Other fatigue  - Vitamin B12  9. Hypertension, uncontrolled  - amLODipine (NORVASC) 5 MG tablet; Take 1 tablet (5 mg total) by mouth daily.  Dispense: 30 tablet; Refill: 1 - telmisartan-hydrochlorothiazide (MICARDIS HCT) 80-25 MG tablet; Take 1 tablet by mouth daily.  Dispense: 30 tablet; Refill: 1  10. Non-compliance  Explained importance of compliance with follow ups and mendications  11. Senile purpura (HCC)  Reassurance

## 2017-12-02 LAB — COMPLETE METABOLIC PANEL WITH GFR
AG Ratio: 1.4 (calc) (ref 1.0–2.5)
ALBUMIN MSPROF: 4.2 g/dL (ref 3.6–5.1)
ALKALINE PHOSPHATASE (APISO): 95 U/L (ref 33–130)
ALT: 12 U/L (ref 6–29)
AST: 17 U/L (ref 10–35)
BUN / CREAT RATIO: 23 (calc) — AB (ref 6–22)
BUN: 24 mg/dL (ref 7–25)
CALCIUM: 9.3 mg/dL (ref 8.6–10.4)
CO2: 27 mmol/L (ref 20–32)
CREATININE: 1.04 mg/dL — AB (ref 0.60–0.93)
Chloride: 107 mmol/L (ref 98–110)
GFR, EST AFRICAN AMERICAN: 59 mL/min/{1.73_m2} — AB (ref >=60)
GFR, EST NON AFRICAN AMERICAN: 51 mL/min/{1.73_m2} — AB (ref >=60)
GLUCOSE: 102 mg/dL (ref 65–139)
Globulin: 2.9 g/dL (calc) (ref 1.9–3.7)
Potassium: 3.7 mmol/L (ref 3.5–5.3)
Sodium: 144 mmol/L (ref 135–146)
TOTAL PROTEIN: 7.1 g/dL (ref 6.1–8.1)
Total Bilirubin: 0.5 mg/dL (ref 0.2–1.2)

## 2017-12-02 LAB — LIPID PANEL
CHOL/HDL RATIO: 5.3 (calc) — AB (ref <5.0)
CHOLESTEROL: 252 mg/dL — AB (ref <200)
HDL: 48 mg/dL — AB (ref >50)
LDL Cholesterol (Calc): 186 mg/dL (calc) — ABNORMAL HIGH
Non-HDL Cholesterol (Calc): 204 mg/dL (calc) — ABNORMAL HIGH (ref <130)
TRIGLYCERIDES: 79 mg/dL (ref <150)

## 2017-12-02 LAB — MICROALBUMIN / CREATININE URINE RATIO
CREATININE, URINE: 137 mg/dL (ref 20–275)
MICROALB UR: 3.2 mg/dL
MICROALB/CREAT RATIO: 23 ug/mg{creat} (ref <30)

## 2017-12-02 LAB — VITAMIN B12: Vitamin B-12: 304 pg/mL (ref 200–1100)

## 2017-12-02 LAB — VITAMIN D 25 HYDROXY (VIT D DEFICIENCY, FRACTURES): VIT D 25 HYDROXY: 12 ng/mL — AB (ref 30–100)

## 2017-12-02 LAB — PARATHYROID HORMONE, INTACT (NO CA): PTH: 109 pg/mL — AB (ref 14–64)

## 2017-12-02 LAB — TSH: TSH: 19.15 mIU/L — ABNORMAL HIGH (ref 0.40–4.50)

## 2017-12-04 ENCOUNTER — Ambulatory Visit: Payer: 59

## 2017-12-04 VITALS — BP 144/72 | HR 72

## 2017-12-04 DIAGNOSIS — N183 Chronic kidney disease, stage 3 (moderate): Principal | ICD-10-CM

## 2017-12-04 DIAGNOSIS — I129 Hypertensive chronic kidney disease with stage 1 through stage 4 chronic kidney disease, or unspecified chronic kidney disease: Secondary | ICD-10-CM

## 2017-12-04 NOTE — Progress Notes (Signed)
Patient is here for a blood pressure check. Patient denies chest pain, palpitations, shortness of breath or visual disturbances. At previous visit blood pressure was 205/110 with a heart rate of 75. Today during nurse visit first check blood pressure was 156/82. After resting for 10 minutes it was 144/72 and heart rate was 72. She does take any blood pressure medications.

## 2017-12-06 ENCOUNTER — Other Ambulatory Visit: Payer: Self-pay | Admitting: Family Medicine

## 2017-12-06 ENCOUNTER — Encounter: Payer: Self-pay | Admitting: Family Medicine

## 2017-12-06 DIAGNOSIS — N2581 Secondary hyperparathyroidism of renal origin: Secondary | ICD-10-CM | POA: Insufficient documentation

## 2017-12-06 DIAGNOSIS — E538 Deficiency of other specified B group vitamins: Secondary | ICD-10-CM | POA: Insufficient documentation

## 2017-12-06 DIAGNOSIS — E559 Vitamin D deficiency, unspecified: Secondary | ICD-10-CM | POA: Insufficient documentation

## 2017-12-06 MED ORDER — VITAMIN D (ERGOCALCIFEROL) 1.25 MG (50000 UNIT) PO CAPS: 50000.0000 [IU] | ORAL_CAPSULE | ORAL | 0 refills | Status: DC

## 2017-12-15 ENCOUNTER — Telehealth: Payer: Self-pay

## 2017-12-15 ENCOUNTER — Encounter: Payer: Self-pay | Admitting: Nurse Practitioner

## 2017-12-15 ENCOUNTER — Ambulatory Visit (INDEPENDENT_AMBULATORY_CARE_PROVIDER_SITE_OTHER): Payer: 59 | Admitting: Nurse Practitioner

## 2017-12-15 VITALS — BP 140/60 | HR 75 | Temp 98.0°F | Resp 16 | Ht 62.0 in | Wt 177.9 lb

## 2017-12-15 DIAGNOSIS — G44209 Tension-type headache, unspecified, not intractable: Secondary | ICD-10-CM | POA: Diagnosis not present

## 2017-12-15 NOTE — Progress Notes (Signed)
Name: Kristin Juarez   MRN: [id number]    DOB: 1936/02/05   Date:12/15/2017       Progress Note  Subjective  Chief Complaint  Chief Complaint  Patient presents with  . Headache    She had a headache on Friday and wasn't able to go to work. She needs a work excuse. Headache has resolved.    HPI  Patient had headache started Friday evening states called manager and went home and rested and woke up and the headache was gone. Did not take anything for headache. No nausea, blurry vision, dizziness. States is a tension headache she has had them before- goes away with sleep. Work states she cannot come back without work note.   Patient Active Problem List   Diagnosis Date Noted  . Hyperparathyroidism, secondary renal (HCC) 12/06/2017  . Vitamin D deficiency 12/06/2017  . Low serum vitamin B12 12/06/2017  . Severe major depression, single episode, without psychotic features (HCC) 12/01/2017  . Advanced care planning/counseling discussion 07/11/2016  . PVD (peripheral vascular disease) (HCC) 12/18/2014  . Hyperlipidemia 12/18/2014  . Osteoporosis 12/18/2014  . Hypertensive CKD (chronic kidney disease) 12/18/2014  . CKD (chronic kidney disease), stage III (HCC) 12/18/2014  . Hypothyroidism 12/18/2014    Past Medical History:  Diagnosis Date  . Hyperlipidemia   . Hypertension   . Hypertensive CKD (chronic kidney disease)   . Hypothyroid   . Osteoporosis   . PVD (peripheral vascular disease) (HCC)     Past Surgical History:  Procedure Laterality Date  . ABDOMINAL HYSTERECTOMY    . CLEFT PALATE REPAIR      Social History   Tobacco Use  . Smoking status: Never Smoker  . Smokeless tobacco: Never Used  Substance Use Topics  . Alcohol use: No    Alcohol/week: 0.0 standard drinks     Current Outpatient Medications:  .  amLODipine (NORVASC) 5 MG tablet, Take 1 tablet (5 mg total) by mouth daily., Disp: 30 tablet, Rfl: 1 .  rosuvastatin (CRESTOR) 40 MG tablet, Take 1 tablet  (40 mg total) by mouth daily., Disp: 30 tablet, Rfl: 1 .  SYNTHROID 75 MCG tablet, Take 1 tablet (75 mcg total) by mouth daily before breakfast., Disp: 30 tablet, Rfl: 1 .  telmisartan-hydrochlorothiazide (MICARDIS HCT) 80-25 MG tablet, Take 1 tablet by mouth daily., Disp: 30 tablet, Rfl: 1 .  Vitamin D, Ergocalciferol, (DRISDOL) 50000 units CAPS capsule, Take 1 capsule (50,000 Units total) by mouth every 7 (seven) days., Disp: 12 capsule, Rfl: 0  Allergies  Allergen Reactions  . Cozaar [Losartan Potassium] Other (See Comments)    Gas   . Crestor [Rosuvastatin Calcium] Other (See Comments)    gas  . Lipitor [Atorvastatin] Other (See Comments)    "full of water"  . Zocor [Simvastatin]     ROS   No other specific complaints in a complete review of systems (except as listed in HPI above).  Objective  Vitals:   12/15/17 1107  BP: 140/60  Pulse: 75  Resp: 16  Temp: 98 F (36.7 C)  TempSrc: Oral  SpO2: 98%  Weight: 177 lb 14.4 oz (80.7 kg)  Height: 5\' 2"  (1.575 m)     Body mass index is 32.54 kg/m.  Nursing Note and Vital Signs reviewed.  Physical Exam  Constitutional: In no acute distress Cardiovascular: heart sounds normal, pulses intact Pulmonary: Lung sounds clear bilaterally throughout Neurological: Alert and oriented x3, steady gait, EOMs intact, equal strength and good coordination.   No results  found for this or any previous visit (from the past 48 hour(s)).  Assessment & Plan  1. Acute non intractable tension-type headache Discussed prevention with proper hydration, rest, tylenol if needed and warning sisngs Work note given.  -Reviewed Health Maintenance: number given for DEXA scan and invited to return in 2 weeks for flu shot.

## 2017-12-15 NOTE — Patient Instructions (Signed)
Please do call to schedule your bone density study; the number to schedule one at either [facility] or [facility] is [phone]

## 2017-12-15 NOTE — Telephone Encounter (Signed)
Copied from CRM [phone]. Topic: General - Other >> Dec 15, 2017  8:45 AM Arlyss Gandyichardson, Taren N, NT wrote: Reason for CRM: Pt states she needs a note for work so that she may be able to go back to work. She was out Friday a migraine. She states she needs the return to work note today if possible.  Patient has been put on the scheduled to be seen by Sharyon CableElizabeth Poulose, FNP

## 2017-12-26 ENCOUNTER — Other Ambulatory Visit: Payer: Self-pay | Admitting: Family Medicine

## 2017-12-26 DIAGNOSIS — E039 Hypothyroidism, unspecified: Secondary | ICD-10-CM

## 2018-01-12 ENCOUNTER — Ambulatory Visit: Payer: 59 | Admitting: Family Medicine

## 2018-02-10 ENCOUNTER — Ambulatory Visit: Payer: 59 | Admitting: Family Medicine

## 2018-07-13 ENCOUNTER — Ambulatory Visit: Payer: 59 | Admitting: Family Medicine

## 2018-07-13 ENCOUNTER — Encounter: Payer: Self-pay | Admitting: Family Medicine

## 2018-07-13 VITALS — BP 190/100 | HR 70 | Temp 98.3°F | Resp 16 | Ht 62.0 in | Wt 179.0 lb

## 2018-07-13 DIAGNOSIS — N183 Chronic kidney disease, stage 3 unspecified: Secondary | ICD-10-CM

## 2018-07-13 DIAGNOSIS — N2581 Secondary hyperparathyroidism of renal origin: Secondary | ICD-10-CM

## 2018-07-13 DIAGNOSIS — I129 Hypertensive chronic kidney disease with stage 1 through stage 4 chronic kidney disease, or unspecified chronic kidney disease: Secondary | ICD-10-CM | POA: Diagnosis not present

## 2018-07-13 DIAGNOSIS — I739 Peripheral vascular disease, unspecified: Secondary | ICD-10-CM | POA: Diagnosis not present

## 2018-07-13 DIAGNOSIS — R0989 Other specified symptoms and signs involving the circulatory and respiratory systems: Secondary | ICD-10-CM

## 2018-07-13 DIAGNOSIS — Z9119 Patient's noncompliance with other medical treatment and regimen: Secondary | ICD-10-CM

## 2018-07-13 DIAGNOSIS — E538 Deficiency of other specified B group vitamins: Secondary | ICD-10-CM

## 2018-07-13 DIAGNOSIS — F325 Major depressive disorder, single episode, in full remission: Secondary | ICD-10-CM

## 2018-07-13 DIAGNOSIS — D692 Other nonthrombocytopenic purpura: Secondary | ICD-10-CM

## 2018-07-13 DIAGNOSIS — Z23 Encounter for immunization: Secondary | ICD-10-CM | POA: Diagnosis not present

## 2018-07-13 DIAGNOSIS — E7800 Pure hypercholesterolemia, unspecified: Secondary | ICD-10-CM | POA: Diagnosis not present

## 2018-07-13 DIAGNOSIS — Z91199 Patient's noncompliance with other medical treatment and regimen due to unspecified reason: Secondary | ICD-10-CM

## 2018-07-13 DIAGNOSIS — E039 Hypothyroidism, unspecified: Secondary | ICD-10-CM

## 2018-07-13 DIAGNOSIS — E559 Vitamin D deficiency, unspecified: Secondary | ICD-10-CM

## 2018-07-13 DIAGNOSIS — I10 Essential (primary) hypertension: Secondary | ICD-10-CM | POA: Diagnosis not present

## 2018-07-13 MED ORDER — CYANOCOBALAMIN 1000 MCG/ML IJ SOLN: 1000.0000 ug | Freq: Once | INTRAMUSCULAR | 0 refills | Status: AC

## 2018-07-13 MED ORDER — AMLODIPINE BESYLATE 5 MG PO TABS: 5.0000 mg | ORAL_TABLET | Freq: Every day | ORAL | 1 refills | Status: DC

## 2018-07-13 MED ORDER — UMECLIDINIUM BROMIDE 62.5 MCG/INH IN AEPB: 1.0000 | INHALATION_SPRAY | Freq: Every day | RESPIRATORY_TRACT | 0 refills | Status: DC

## 2018-07-13 MED ORDER — CYANOCOBALAMIN 1000 MCG/ML IJ SOLN
1000.0000 ug | INTRAMUSCULAR | Status: AC
  Administered 2018-07-13 – 2018-12-22 (×2): 1000 ug via INTRAMUSCULAR

## 2018-07-13 MED ORDER — TELMISARTAN-HCTZ 80-25 MG PO TABS: 1.0000 | ORAL_TABLET | Freq: Every day | ORAL | 1 refills | Status: DC

## 2018-07-13 MED ORDER — SYNTHROID 75 MCG PO TABS: ORAL_TABLET | ORAL | 1 refills | Status: DC

## 2018-07-13 MED ORDER — ROSUVASTATIN CALCIUM 40 MG PO TABS: 40.0000 mg | ORAL_TABLET | Freq: Every day | ORAL | 1 refills | Status: DC

## 2018-07-13 NOTE — Progress Notes (Signed)
Name: Kristin Juarez   MRN: [id number]    DOB: 28-Apr-1936   Date:07/13/2018       Progress Note  Subjective  Chief Complaint  Chief Complaint  Patient presents with  . Hypertension    BP is elevated. She has been out of medication for a good while.  . Medication Refill    HPI  She works at [employer], she was sent here last July because she was fatigued, she is back today because she is out of medication and her job told her she had to come in. Explained importance of compliance with follow up visits and medications  Uncontrolled HTN and hypertensive kidney disease stage III with secondary hyperparathyroidism: used to see Dr. Thedore Mins however stopped going because of cost. She denies pruritis , she has normal urine output. She denies chest pain or palpitation Last GFR was stable, we will resume medications and recheck labs prior to next visit   Major depression severe episode acute: last phq 9 was 20, she states she likes to work and does not want to retire yet, she states mood is better now, but worries about losing her job  Hypothyroidism: she has a long history of hypothyroidism since thyroidectomy for unknown cause. She has not been taking medication for the past month . She has dry skin, she denies constipation. She will resume medication and return in 6 weeks for labs   PVD: she does not recall seeing vascular surgeon, has edema legs, she denies claudication, she does not want to see vascular surgeon at this time. Unchanged   Hyperlipidemia: discussed importance of starting statin therapy and she agrees. She states she tried crestor and tolerated it well, we will resume medication and recheck labs  Senile Purpura: stable on both arms, reassurance given for now  Vitamin D and B12 deficiency: she does not like taking pills, but is willing to try B12 injection today   Patient Active Problem List   Diagnosis Date Noted  . Hyperparathyroidism, secondary renal (HCC) 12/06/2017  .  Vitamin D deficiency 12/06/2017  . Low serum vitamin B12 12/06/2017  . Severe major depression, single episode, without psychotic features (HCC) 12/01/2017  . Advanced care planning/counseling discussion 07/11/2016  . PVD (peripheral vascular disease) (HCC) 12/18/2014  . Hyperlipidemia 12/18/2014  . Osteoporosis 12/18/2014  . Hypertensive CKD (chronic kidney disease) 12/18/2014  . CKD (chronic kidney disease), stage III (HCC) 12/18/2014  . Hypothyroidism 12/18/2014    Past Surgical History:  Procedure Laterality Date  . ABDOMINAL HYSTERECTOMY    . CLEFT PALATE REPAIR      Family History  Problem Relation Age of Onset  . Heart disease Mother   . Heart attack Mother   . Heart disease Father   . Stroke Father   . Alcohol abuse Brother     Social History   Socioeconomic History  . Marital status: Single    Spouse name: Not on file  . Number of children: 0  . Years of education: Not on file  . Highest education level: Bachelor's degree (e.g., BA, AB, BS)  Occupational History  . Not on file  Social Needs  . Financial resource strain: Not hard at all  . Food insecurity:    Worry: Never true    Inability: Never true  . Transportation needs:    Medical: No    Non-medical: No  Tobacco Use  . Smoking status: Never Smoker  . Smokeless tobacco: Never Used  Substance and Sexual Activity  . Alcohol  use: No    Alcohol/week: 0.0 standard drinks  . Drug use: No  . Sexual activity: Never  Lifestyle  . Physical activity:    Days per week: 7 days    Minutes per session: Not on file  . Stress: Not at all  Relationships  . Social connections:    Talks on phone: Once a week    Gets together: Never    Attends religious service: Never    Active member of club or organization: No    Attends meetings of clubs or organizations: Never    Relationship status: Never married  . Intimate partner violence:    Fear of current or ex partner: No    Emotionally abused: No    Physically  abused: No    Forced sexual activity: No  Other Topics Concern  . Not on file  Social History Narrative  . Not on file     Current Outpatient Medications:  .  amLODipine (NORVASC) 5 MG tablet, Take 1 tablet (5 mg total) by mouth daily., Disp: 30 tablet, Rfl: 1 .  rosuvastatin (CRESTOR) 40 MG tablet, Take 1 tablet (40 mg total) by mouth daily., Disp: 30 tablet, Rfl: 1 .  SYNTHROID 75 MCG tablet, TAKE 1 TABLET BY MOUTH ONCE DAILY BEFOREBREAKAST, Disp: 30 tablet, Rfl: 1 .  telmisartan-hydrochlorothiazide (MICARDIS HCT) 80-25 MG tablet, Take 1 tablet by mouth daily., Disp: 30 tablet, Rfl: 1 .  Vitamin D, Ergocalciferol, (DRISDOL) 50000 units CAPS capsule, Take 1 capsule (50,000 Units total) by mouth every 7 (seven) days., Disp: 12 capsule, Rfl: 0  Allergies  Allergen Reactions  . Cozaar [Losartan Potassium] Other (See Comments)    Gas   . Crestor [Rosuvastatin Calcium] Other (See Comments)    gas  . Lipitor [Atorvastatin] Other (See Comments)    "full of water"  . Zocor [Simvastatin]     I personally reviewed active problem list, medication list, allergies, family history, social history with the patient/caregiver today.   ROS  Constitutional: Negative for fever or weight change.  Respiratory: Positive  for cough but no  shortness of breath.   Cardiovascular: Negative for chest pain or palpitations.  Gastrointestinal: Negative for abdominal pain, no bowel changes.  Musculoskeletal: Negative for gait problem or joint swelling.  Skin: Negative for rash. She has been bruising easily  Neurological: Negative for dizziness or headache.  No other specific complaints in a complete review of systems (except as listed in HPI above).  Objective  Vitals:   07/13/18 1421  BP: (!) 190/100  Pulse: 70  Resp: 16  Temp: 98.3 F (36.8 C)  TempSrc: Oral  SpO2: 97%  Weight: 179 lb (81.2 kg)  Height: 5\' 2"  (1.575 m)    Body mass index is 32.74 kg/m.  Physical Exam  Constitutional:  Patient appears well-developed and well-nourished. Obese No distress.  HEENT: head atraumatic, normocephalic, pupils equal and reactive to light, eneck supple, throat within normal limits Cardiovascular: Normal rate, regular rhythm and normal heart sounds.  No murmur heard. No BLE edema. Pulmonary/Chest: Effort normal , she has rhonchi No respiratory distress. Abdominal: Soft.  There is no tenderness. Muscular Skeletal: scoliosis  Skin: senile purpura  Psychiatric: Patient has a normal mood and affect. behavior is normal. Judgment and thought content normal.  PHQ2/9: Depression screen [facility] 2/9 07/13/2018 12/01/2017 07/11/2016 12/18/2014  Decreased Interest 0 3 0 1  Down, Depressed, Hopeless 0 2 0 0  PHQ - 2 Score 0 5 0 1  Altered sleeping 1  3 0 -  Tired, decreased energy 1 3 1  -  Change in appetite 0 2 0 -  Feeling bad or failure about yourself  0 1 0 -  Trouble concentrating 0 2 0 -  Moving slowly or fidgety/restless 0 2 0 -  Suicidal thoughts 0 2 0 -  PHQ-9 Score 2 20 1  -  Difficult doing work/chores Not difficult at all Somewhat difficult - -    Fall Risk: Fall Risk  12/15/2017 07/11/2016 12/18/2014  Falls in the past year? No No No     Assessment & Plan   1. Hypertensive kidney disease with stage 3 chronic kidney disease (HCC)  - amLODipine (NORVASC) 5 MG tablet; Take 1 tablet (5 mg total) by mouth daily.  Dispense: 30 tablet; Refill: 1 - rosuvastatin (CRESTOR) 40 MG tablet; Take 1 tablet (40 mg total) by mouth daily.  Dispense: 30 tablet; Refill: 1 - telmisartan-hydrochlorothiazide (MICARDIS HCT) 80-25 MG tablet; Take 1 tablet by mouth daily.  Dispense: 30 tablet; Refill: 1  2. Hypertension, uncontrolled  - amLODipine (NORVASC) 5 MG tablet; Take 1 tablet (5 mg total) by mouth daily.  Dispense: 30 tablet; Refill: 1 - telmisartan-hydrochlorothiazide (MICARDIS HCT) 80-25 MG tablet; Take 1 tablet by mouth daily.  Dispense: 30 tablet; Refill: 1 - COMPLETE METABOLIC PANEL WITH GFR -  CBC with Differential/Platelet  3. PVD (peripheral vascular disease) (HCC)  - rosuvastatin (CRESTOR) 40 MG tablet; Take 1 tablet (40 mg total) by mouth daily.  Dispense: 30 tablet; Refill: 1  4. Pure hypercholesterolemia  - rosuvastatin (CRESTOR) 40 MG tablet; Take 1 tablet (40 mg total) by mouth daily.  Dispense: 30 tablet; Refill: 1 - Lipid panel  5. Hypothyroidism, unspecified type  - SYNTHROID 75 MCG tablet; TAKE 1 TABLET BY MOUTH ONCE DAILY BEFOREBREAKAST  Dispense: 30 tablet; Refill: 1 - TSH  6. Major Depression in Remission  [facility])  She states she is doing well now  7. Senile purpura (HCC)  Stable   8. Hyperparathyroidism, secondary renal (HCC)  Recheck labs next visit , resume medications now  9. Vitamin D deficiency  - VITAMIN D 25 Hydroxy (Vit-D Deficiency, Fractures)  10. Non-compliance   11. Low serum vitamin B12  - B12 and Folate Panel - cyanocobalamin (,VITAMIN B-12,) 1000 MCG/ML injection; Inject 1 mL (1,000 mcg total) into the muscle once for 1 dose.  Dispense: 1 mL; Refill: 0  12. Needs flu shot  - Flu vaccine HIGH DOSE PF   13. 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 She never smoked, she just brought it up after I listened to her lungs.  We will try Incruse since it does not raise her bp

## 2018-08-10 ENCOUNTER — Other Ambulatory Visit: Payer: Self-pay | Admitting: Family Medicine

## 2018-08-10 DIAGNOSIS — R0989 Other specified symptoms and signs involving the circulatory and respiratory systems: Secondary | ICD-10-CM

## 2018-08-10 DIAGNOSIS — E538 Deficiency of other specified B group vitamins: Secondary | ICD-10-CM

## 2018-08-11 NOTE — Telephone Encounter (Signed)
Tried calling pt, no answer, will try again.

## 2018-08-25 ENCOUNTER — Other Ambulatory Visit: Payer: Self-pay | Admitting: Family Medicine

## 2018-08-25 DIAGNOSIS — I10 Essential (primary) hypertension: Secondary | ICD-10-CM

## 2018-08-25 DIAGNOSIS — I129 Hypertensive chronic kidney disease with stage 1 through stage 4 chronic kidney disease, or unspecified chronic kidney disease: Secondary | ICD-10-CM

## 2018-08-25 DIAGNOSIS — N183 Chronic kidney disease, stage 3 (moderate): Principal | ICD-10-CM

## 2018-08-25 NOTE — Telephone Encounter (Signed)
LVM for pt to call the office to schedule an appt. °

## 2018-08-25 NOTE — Telephone Encounter (Signed)
Please schedule patient for a visit per Dr. Carlynn Purl

## 2018-08-27 ENCOUNTER — Emergency Department: Payer: 59

## 2018-08-27 ENCOUNTER — Inpatient Hospital Stay
Admission: EM | Admit: 2018-08-27 | Discharge: 2018-08-29 | DRG: 282 | Disposition: A | Discharge status: Home / Self Care | Payer: 59 | Attending: Internal Medicine | Admitting: Internal Medicine

## 2018-08-27 ENCOUNTER — Other Ambulatory Visit: Payer: Self-pay

## 2018-08-27 ENCOUNTER — Emergency Department: Admission: EM | Admit: 2018-08-27 | Payer: 59 | Source: Home / Self Care

## 2018-08-27 DIAGNOSIS — Z79899 Other long term (current) drug therapy: Secondary | ICD-10-CM

## 2018-08-27 DIAGNOSIS — R7989 Other specified abnormal findings of blood chemistry: Secondary | ICD-10-CM | POA: Diagnosis not present

## 2018-08-27 DIAGNOSIS — I129 Hypertensive chronic kidney disease with stage 1 through stage 4 chronic kidney disease, or unspecified chronic kidney disease: Secondary | ICD-10-CM | POA: Diagnosis present

## 2018-08-27 DIAGNOSIS — Z811 Family history of alcohol abuse and dependence: Secondary | ICD-10-CM

## 2018-08-27 DIAGNOSIS — Z66 Do not resuscitate: Secondary | ICD-10-CM | POA: Diagnosis present

## 2018-08-27 DIAGNOSIS — I10 Essential (primary) hypertension: Secondary | ICD-10-CM | POA: Diagnosis not present

## 2018-08-27 DIAGNOSIS — Z823 Family history of stroke: Secondary | ICD-10-CM | POA: Diagnosis not present

## 2018-08-27 DIAGNOSIS — Z23 Encounter for immunization: Secondary | ICD-10-CM

## 2018-08-27 DIAGNOSIS — I214 Non-ST elevation (NSTEMI) myocardial infarction: Principal | ICD-10-CM | POA: Diagnosis present

## 2018-08-27 DIAGNOSIS — M810 Age-related osteoporosis without current pathological fracture: Secondary | ICD-10-CM | POA: Diagnosis present

## 2018-08-27 DIAGNOSIS — S0001XA Abrasion of scalp, initial encounter: Secondary | ICD-10-CM | POA: Diagnosis present

## 2018-08-27 DIAGNOSIS — I361 Nonrheumatic tricuspid (valve) insufficiency: Secondary | ICD-10-CM | POA: Diagnosis not present

## 2018-08-27 DIAGNOSIS — E559 Vitamin D deficiency, unspecified: Secondary | ICD-10-CM | POA: Diagnosis present

## 2018-08-27 DIAGNOSIS — I739 Peripheral vascular disease, unspecified: Secondary | ICD-10-CM | POA: Diagnosis present

## 2018-08-27 DIAGNOSIS — R55 Syncope and collapse: Secondary | ICD-10-CM

## 2018-08-27 DIAGNOSIS — S61412A Laceration without foreign body of left hand, initial encounter: Secondary | ICD-10-CM | POA: Diagnosis not present

## 2018-08-27 DIAGNOSIS — Z8249 Family history of ischemic heart disease and other diseases of the circulatory system: Secondary | ICD-10-CM | POA: Diagnosis not present

## 2018-08-27 DIAGNOSIS — N183 Chronic kidney disease, stage 3 (moderate): Secondary | ICD-10-CM | POA: Diagnosis present

## 2018-08-27 DIAGNOSIS — R778 Other specified abnormalities of plasma proteins: Secondary | ICD-10-CM | POA: Diagnosis not present

## 2018-08-27 DIAGNOSIS — E785 Hyperlipidemia, unspecified: Secondary | ICD-10-CM | POA: Diagnosis present

## 2018-08-27 DIAGNOSIS — E039 Hypothyroidism, unspecified: Secondary | ICD-10-CM | POA: Diagnosis present

## 2018-08-27 DIAGNOSIS — Z79890 Hormone replacement therapy (postmenopausal): Secondary | ICD-10-CM | POA: Diagnosis not present

## 2018-08-27 DIAGNOSIS — I639 Cerebral infarction, unspecified: Secondary | ICD-10-CM

## 2018-08-27 DIAGNOSIS — Z888 Allergy status to other drugs, medicaments and biological substances status: Secondary | ICD-10-CM

## 2018-08-27 LAB — CBC WITH DIFFERENTIAL/PLATELET
Abs Immature Granulocytes: 0.02 10*3/uL (ref 0.00–0.07)
Basophils Absolute: 0 10*3/uL (ref 0.0–0.1)
Basophils Relative: 1 %
Eosinophils Absolute: 0.1 10*3/uL (ref 0.0–0.5)
Eosinophils Relative: 2 %
HCT: 42.1 % (ref 36.0–46.0)
Hemoglobin: 13.5 g/dL (ref 12.0–15.0)
Immature Granulocytes: 0 %
Lymphocytes Relative: 18 %
Lymphs Abs: 1.2 10*3/uL (ref 0.7–4.0)
MCH: 29.3 pg (ref 26.0–34.0)
MCHC: 32.1 g/dL (ref 30.0–36.0)
MCV: 91.3 fL (ref 80.0–100.0)
Monocytes Absolute: 0.4 10*3/uL (ref 0.1–1.0)
Monocytes Relative: 6 %
Neutro Abs: 4.6 10*3/uL (ref 1.7–7.7)
Neutrophils Relative %: 73 %
Platelets: 169 10*3/uL (ref 150–400)
RBC: 4.61 MIL/uL (ref 3.87–5.11)
RDW: 13 % (ref 11.5–15.5)
WBC: 6.4 10*3/uL (ref 4.0–10.5)
nRBC: 0 % (ref 0.0–0.2)

## 2018-08-27 LAB — COMPREHENSIVE METABOLIC PANEL
ALT: 11 U/L (ref 0–44)
AST: 19 U/L (ref 15–41)
Albumin: 3.8 g/dL (ref 3.5–5.0)
Alkaline Phosphatase: 86 U/L (ref 38–126)
Anion gap: 10 (ref 5–15)
BUN: 21 mg/dL (ref 8–23)
CO2: 26 mmol/L (ref 22–32)
Calcium: 9 mg/dL (ref 8.9–10.3)
Chloride: 108 mmol/L (ref 98–111)
Creatinine, Ser: 1.05 mg/dL — ABNORMAL HIGH (ref 0.44–1.00)
GFR calc Af Amer: 58 mL/min — ABNORMAL LOW (ref >60)
GFR calc non Af Amer: 50 mL/min — ABNORMAL LOW (ref >60)
Glucose, Bld: 119 mg/dL — ABNORMAL HIGH (ref 70–99)
Potassium: 3.6 mmol/L (ref 3.5–5.1)
Sodium: 144 mmol/L (ref 135–145)
Total Bilirubin: 0.6 mg/dL (ref 0.3–1.2)
Total Protein: 7.1 g/dL (ref 6.5–8.1)

## 2018-08-27 LAB — PROTIME-INR
INR: 1.1 (ref 0.8–1.2)
Prothrombin Time: 14.2 seconds (ref 11.4–15.2)

## 2018-08-27 LAB — TSH: TSH: 4.51 u[IU]/mL — ABNORMAL HIGH (ref 0.350–4.500)

## 2018-08-27 LAB — TROPONIN I
Troponin I: 0.33 ng/mL (ref <0.03)
Troponin I: 0.83 ng/mL (ref <0.03)

## 2018-08-27 LAB — APTT: aPTT: 138 seconds — ABNORMAL HIGH (ref 24–36)

## 2018-08-27 MED ORDER — POLYETHYLENE GLYCOL 3350 17 G PO PACK: 17.0000 g | PACK | Freq: Every day | ORAL | Status: DC | PRN

## 2018-08-27 MED ORDER — HYDROCHLOROTHIAZIDE 25 MG PO TABS
25.0000 mg | ORAL_TABLET | Freq: Every day | ORAL | Status: DC
  Administered 2018-08-28 – 2018-08-29 (×2): 25 mg via ORAL
  Filled 2018-08-27 (×2): qty 1

## 2018-08-27 MED ORDER — TETANUS-DIPHTH-ACELL PERTUSSIS 5-2.5-18.5 LF-MCG/0.5 IM SUSP
0.5000 mL | Freq: Once | INTRAMUSCULAR | Status: AC
  Administered 2018-08-27: 15:00:00 0.5 mL via INTRAMUSCULAR
  Filled 2018-08-27: qty 0.5

## 2018-08-27 MED ORDER — ROSUVASTATIN CALCIUM 10 MG PO TABS
40.0000 mg | ORAL_TABLET | Freq: Every day | ORAL | Status: DC
  Administered 2018-08-28: 40 mg via ORAL
  Filled 2018-08-27: qty 4

## 2018-08-27 MED ORDER — ACETAMINOPHEN 325 MG PO TABS: 650.0000 mg | ORAL_TABLET | Freq: Four times a day (QID) | ORAL | Status: DC | PRN

## 2018-08-27 MED ORDER — TELMISARTAN-HCTZ 80-25 MG PO TABS: 1.0000 | ORAL_TABLET | Freq: Every day | ORAL | Status: DC

## 2018-08-27 MED ORDER — HYDRALAZINE HCL 20 MG/ML IJ SOLN: 10.0000 mg | Freq: Four times a day (QID) | INTRAMUSCULAR | Status: DC | PRN

## 2018-08-27 MED ORDER — NITROGLYCERIN 0.4 MG SL SUBL: 0.4000 mg | SUBLINGUAL_TABLET | SUBLINGUAL | Status: DC | PRN

## 2018-08-27 MED ORDER — LEVOTHYROXINE SODIUM 50 MCG PO TABS
75.0000 ug | ORAL_TABLET | Freq: Every day | ORAL | Status: DC
  Administered 2018-08-28 – 2018-08-29 (×2): 75 ug via ORAL
  Filled 2018-08-27 (×2): qty 1

## 2018-08-27 MED ORDER — IRBESARTAN 150 MG PO TABS
300.0000 mg | ORAL_TABLET | Freq: Every day | ORAL | Status: DC
  Administered 2018-08-28 – 2018-08-29 (×2): 300 mg via ORAL
  Filled 2018-08-27 (×3): qty 2

## 2018-08-27 MED ORDER — SODIUM CHLORIDE 0.9% FLUSH
3.0000 mL | Freq: Two times a day (BID) | INTRAVENOUS | Status: DC
  Administered 2018-08-28 – 2018-08-29 (×3): 3 mL via INTRAVENOUS

## 2018-08-27 MED ORDER — LIDOCAINE HCL (PF) 1 % IJ SOLN
5.0000 mL | Freq: Once | INTRAMUSCULAR | Status: DC
  Filled 2018-08-27: qty 5

## 2018-08-27 MED ORDER — AMLODIPINE BESYLATE 5 MG PO TABS: 5.0000 mg | ORAL_TABLET | Freq: Every day | ORAL | Status: DC

## 2018-08-27 MED ORDER — ONDANSETRON HCL 4 MG/2ML IJ SOLN: 4.0000 mg | Freq: Four times a day (QID) | INTRAMUSCULAR | Status: DC | PRN

## 2018-08-27 MED ORDER — ACETAMINOPHEN 325 MG PO TABS
650.0000 mg | ORAL_TABLET | Freq: Once | ORAL | Status: AC
  Administered 2018-08-27: 650 mg via ORAL
  Filled 2018-08-27: qty 2

## 2018-08-27 MED ORDER — ASPIRIN EC 81 MG PO TBEC
81.0000 mg | DELAYED_RELEASE_TABLET | Freq: Every day | ORAL | Status: DC
  Administered 2018-08-28 – 2018-08-29 (×2): 81 mg via ORAL
  Filled 2018-08-27 (×2): qty 1

## 2018-08-27 MED ORDER — UMECLIDINIUM BROMIDE 62.5 MCG/INH IN AEPB
1.0000 | INHALATION_SPRAY | Freq: Every day | RESPIRATORY_TRACT | Status: DC
  Administered 2018-08-28: 1 via RESPIRATORY_TRACT
  Filled 2018-08-27: qty 7

## 2018-08-27 MED ORDER — ONDANSETRON HCL 4 MG PO TABS: 4.0000 mg | ORAL_TABLET | Freq: Four times a day (QID) | ORAL | Status: DC | PRN

## 2018-08-27 MED ORDER — ALBUTEROL SULFATE (2.5 MG/3ML) 0.083% IN NEBU: 2.5000 mg | INHALATION_SOLUTION | RESPIRATORY_TRACT | Status: DC | PRN

## 2018-08-27 MED ORDER — ACETAMINOPHEN 650 MG RE SUPP: 650.0000 mg | Freq: Four times a day (QID) | RECTAL | Status: DC | PRN

## 2018-08-27 MED ORDER — HEPARIN BOLUS VIA INFUSION
4000.0000 [IU] | Freq: Once | INTRAVENOUS | Status: AC
  Administered 2018-08-27: 4000 [IU] via INTRAVENOUS
  Filled 2018-08-27: qty 4000

## 2018-08-27 MED ORDER — HEPARIN (PORCINE) 25000 UT/250ML-% IV SOLN
800.0000 [IU]/h | INTRAVENOUS | Status: DC
  Administered 2018-08-27: 18:00:00 800 [IU]/h via INTRAVENOUS
  Filled 2018-08-27: qty 250

## 2018-08-27 NOTE — ED Triage Notes (Addendum)
Pt via EMS for syncopal episode in line at [employer], pt reports she is not in any pain but reports a bump on the back of her head. Laceration on the top of her left head, dressed with non adhesive and gauze. Laceration on left finger #3. Also a small laceration on finger # 2 left hand, bleeding controlled

## 2018-08-27 NOTE — H&P (Signed)
[facility] - [place] at [facility]   PATIENT NAME: Kristin ClauseDarlene Juarez    MR#:  [id number]  DATE OF BIRTH:  04/05/1936  DATE OF ADMISSION:  08/27/2018  PRIMARY CARE PHYSICIAN: Alba CorySowles, Krichna, MD   REQUESTING/REFERRING PHYSICIAN: Dr. Neta MendsMc Shane  CHIEF COMPLAINT:   Chief Complaint  Patient presents with  . Loss of Consciousness    HISTORY OF PRESENT ILLNESS:  Kristin Juarez  is a 83 y.o. female with a known history of hypertension, hypothyroidism, CKD stage III presents to the emergency room with episode of syncope while she was at [employer] shopping.  Patient had no prodromal symptoms.  No chest pain or shortness of breath or lightheadedness.  Unknown downtime.  Was found by other shoppers in store employees.  Was brought to the hospital through EMS.  Here patient feels back to normal.  Did not injure herself anywhere.  EKG shows nothing acute.  Troponin found to be 0.33.  No prior cardiac history.  Electrolytes normal.  PAST MEDICAL HISTORY:   Past Medical History:  Diagnosis Date  . Hyperlipidemia   . Hypertension   . Hypertensive CKD (chronic kidney disease)   . Hypothyroid   . Osteoporosis   . PVD (peripheral vascular disease) (HCC)     PAST SURGICAL HISTORY:   Past Surgical History:  Procedure Laterality Date  . ABDOMINAL HYSTERECTOMY    . CLEFT PALATE REPAIR      SOCIAL HISTORY:   Social History   Tobacco Use  . Smoking status: Never Smoker  . Smokeless tobacco: Never Used  Substance Use Topics  . Alcohol use: No    Alcohol/week: 0.0 standard drinks    FAMILY HISTORY:   Family History  Problem Relation Age of Onset  . Heart disease Mother   . Heart attack Mother   . Heart disease Father   . Stroke Father   . Alcohol abuse Brother     DRUG ALLERGIES:   Allergies  Allergen Reactions  . Cozaar [Losartan Potassium] Other (See Comments)    Gas   . Lipitor [Atorvastatin] Other (See Comments)    "full of water"  . Zocor [Simvastatin]      REVIEW OF SYSTEMS:   Review of Systems  Constitutional: Positive for malaise/fatigue. Negative for chills and fever.  HENT: Negative for sore throat.   Eyes: Negative for blurred vision, double vision and pain.  Respiratory: Negative for cough, hemoptysis, shortness of breath and wheezing.   Cardiovascular: Negative for chest pain, palpitations, orthopnea and leg swelling.  Gastrointestinal: Negative for abdominal pain, constipation, diarrhea, heartburn, nausea and vomiting.  Genitourinary: Negative for dysuria and hematuria.  Musculoskeletal: Negative for back pain and joint pain.  Skin: Negative for rash.  Neurological: Positive for loss of consciousness. Negative for sensory change, speech change, focal weakness and headaches.  Endo/Heme/Allergies: Does not bruise/bleed easily.  Psychiatric/Behavioral: Negative for depression. The patient is not nervous/anxious.     MEDICATIONS AT HOME:   Prior to Admission medications   Medication Sig Start Date End Date Taking? Authorizing Provider  amLODipine (NORVASC) 5 MG tablet Take 1 tablet (5 mg total) by mouth daily. 07/13/18  Yes Sowles, Danna HeftyKrichna, MD  rosuvastatin (CRESTOR) 40 MG tablet Take 1 tablet (40 mg total) by mouth daily. 07/13/18  Yes Sowles, Danna HeftyKrichna, MD  SYNTHROID 75 MCG tablet TAKE 1 TABLET BY MOUTH ONCE DAILY BEFOREBREAKAST 07/13/18  Yes Sowles, Danna HeftyKrichna, MD  telmisartan-hydrochlorothiazide (MICARDIS HCT) 80-25 MG tablet Take 1 tablet by mouth daily. 07/13/18  Yes  Alba Cory, MD  umeclidinium bromide (INCRUSE ELLIPTA) 62.5 MCG/INH AEPB Inhale 1 puff into the lungs daily. 07/13/18  Yes Sowles, Danna Hefty, MD     VITAL SIGNS:  Blood pressure (!) 159/66, pulse 65, temperature 98.3 F (36.8 C), temperature source Oral, resp. rate (!) 21, height  (1.626 m), weight 67.6 kg, SpO2 94 %.  PHYSICAL EXAMINATION:  Physical Exam  GENERAL:  83 y.o.-year-old patient lying in the bed with no acute distress.  EYES: Pupils equal,  round, reactive to light and accommodation. No scleral icterus. Extraocular muscles intact.  HEENT: Head atraumatic, normocephalic. Oropharynx and nasopharynx clear. No oropharyngeal erythema, moist oral mucosa  NECK:  Supple, no jugular venous distention. No thyroid enlargement, no tenderness.  LUNGS: Normal breath sounds bilaterally, no wheezing, rales, rhonchi. No use of accessory muscles of respiration.  CARDIOVASCULAR: S1, S2 normal. No murmurs, rubs, or gallops.  ABDOMEN: Soft, nontender, nondistended. Bowel sounds present. No organomegaly or mass.  EXTREMITIES: No pedal edema, cyanosis, or clubbing. + 2 pedal & radial pulses b/l.   NEUROLOGIC: Cranial nerves II through XII are intact. No focal Motor or sensory deficits appreciated b/l PSYCHIATRIC: The patient is alert and oriented x 3. Good affect.  SKIN: No obvious rash, lesion, or ulcer.   LABORATORY PANEL:   CBC Recent Labs  Lab 08/27/18 1405  WBC 6.4  HGB 13.5  HCT 42.1  PLT 169   ------------------------------------------------------------------------------------------------------------------  Chemistries  Recent Labs  Lab 08/27/18 1405  NA 144  K 3.6  CL 108  CO2 26  GLUCOSE 119*  BUN 21  CREATININE 1.05*  CALCIUM 9.0  AST 19  ALT 11  ALKPHOS 86  BILITOT 0.6   ------------------------------------------------------------------------------------------------------------------  Cardiac Enzymes Recent Labs  Lab 08/27/18 1405  TROPONINI 0.33*   ------------------------------------------------------------------------------------------------------------------  RADIOLOGY:  Dg Chest 1 View  Result Date: 08/27/2018 CLINICAL DATA:  Syncope EXAM: CHEST  1 VIEW COMPARISON:  None. FINDINGS: There is elevation of the right diaphragm. There is mild bilateral interstitial thickening likely chronic. There is no focal parenchymal opacity. There is no pleural effusion or pneumothorax. The heart and mediastinal contours  are unremarkable. The osseous structures are unremarkable. IMPRESSION: No active disease. Electronically Signed   By: Elige Ko   On: 08/27/2018 15:39   Ct Head Wo Contrast  Result Date: 08/27/2018 CLINICAL DATA:  83 year old with syncope and fainting. EXAM: CT HEAD WITHOUT CONTRAST TECHNIQUE: Contiguous axial images were obtained from the base of the skull through the vertex without intravenous contrast. COMPARISON:  05/24/2011 FINDINGS: Brain: Mild cerebral atrophy. Hypodensity in the white matter is suggestive for chronic changes. No evidence for acute hemorrhage, mass lesion, midline shift, hydrocephalus or large infarct. Chronic asymmetry of the frontal horns. Vascular: No hyperdense vessel or unexpected calcification. Skull: Normal. Negative for fracture or focal lesion. Sinuses/Orbits: Mild mucosal disease in the frontal and ethmoid air cells. Mild mucosal disease in the maxillary sinuses. Other: Focal soft tissue swelling along the top of the posterior head. IMPRESSION: 1. No acute intracranial abnormality. 2. Hypodensity in the white matter is suggestive for chronic small vessel ischemic disease. 3. Scalp hematoma along the top of the posterior head. No underlying fracture. Electronically Signed   By: Richarda Overlie M.D.   On: 08/27/2018 14:52     IMPRESSION AND PLAN:   *Non-ST elevation MI.  Patient had syncopal episode with significant elevation of troponin at this time.  No prior cardiac history.  No chest pain or shortness of breath.  Most likely  MI or arrhythmia.  Will start heparin drip and repeat troponin.  Admit to telemetry unit.  Start aspirin.  Consult cardiology.  Echocardiogram ordered.  *Syncope.  Differentials include arrhythmia versus MI.  See above  *Hypothyroidism.  Significantly elevated TSH on prior labs.  Will restart patient's home dose of levothyroxine.  Ordered TSH.  *Hypertension.  Continue medications  *DVT prophylaxis.  On heparin drip  All the records are  reviewed and case discussed with ED provider. Management plans discussed with the patient, family and they are in agreement.  CODE STATUS: DNR/DNI  TOTAL TIME TAKING CARE OF THIS PATIENT: 40 minutes.   Molinda Bailiff Joeleen Wortley M.D on 08/27/2018 at 5:09 PM  Between 7am to 6pm - Pager - [phone]  After 6pm go to [email or web address] - password [email or web address] [facility]  [facility] [place] [facility]  Office  [phone]  CC: Primary care physician; Alba Cory, MD  Note: This dictation was prepared with Dragon dictation along with smaller phrase technology. Any transcriptional errors that result from this process are unintentional.

## 2018-08-27 NOTE — ED Provider Notes (Signed)
[facility] Emergency Department Provider Note ____________________________________________  Time seen: 1402  I have reviewed the triage vital signs and the nursing notes.  HISTORY  Chief Complaint  Loss of Consciousness  HPI Kristin Juarez is a 83 y.o. female with the below medical history, presents to the ED via EMS from a local grocery store.  She reportedly had a witnessed syncopal episode while in line at the grocery store.  Patient is denies being ill with feeling unwell prior to the episode.  She sustained a skin tear to the left hand and an abrasion to the top of the scalp.  No other injury reported at this time.  She presents now for further evaluation of her symptoms.  She denies any chest pain, shortness of breath, nausea, vomiting, or dizziness.  Past Medical History:  Diagnosis Date  . Hyperlipidemia   . Hypertension   . Hypertensive CKD (chronic kidney disease)   . Hypothyroid   . Osteoporosis   . PVD (peripheral vascular disease) [facility])     Patient Active Problem List   Diagnosis Date Noted  . Senile purpura (HCC) 07/13/2018  . Hyperparathyroidism, secondary renal (HCC) 12/06/2017  . Vitamin D deficiency 12/06/2017  . Low serum vitamin B12 12/06/2017  . Severe major depression, single episode, without psychotic features (HCC) 12/01/2017  . Advanced care planning/counseling discussion 07/11/2016  . PVD (peripheral vascular disease) (HCC) 12/18/2014  . Hyperlipidemia 12/18/2014  . Osteoporosis 12/18/2014  . Hypertensive CKD (chronic kidney disease) 12/18/2014  . CKD (chronic kidney disease), stage III (HCC) 12/18/2014  . Hypothyroidism 12/18/2014    Past Surgical History:  Procedure Laterality Date  . ABDOMINAL HYSTERECTOMY    . CLEFT PALATE REPAIR      Prior to Admission medications   Medication Sig Start Date End Date Taking? Authorizing Provider  amLODipine (NORVASC) 5 MG tablet Take 1 tablet (5 mg total) by mouth daily. 07/13/18    Alba Cory, MD  rosuvastatin (CRESTOR) 40 MG tablet Take 1 tablet (40 mg total) by mouth daily. 07/13/18   Alba Cory, MD  SYNTHROID 75 MCG tablet TAKE 1 TABLET BY MOUTH ONCE DAILY BEFOREBREAKAST 07/13/18   Alba Cory, MD  telmisartan-hydrochlorothiazide (MICARDIS HCT) 80-25 MG tablet Take 1 tablet by mouth daily. 07/13/18   Alba Cory, MD  umeclidinium bromide (INCRUSE ELLIPTA) 62.5 MCG/INH AEPB Inhale 1 puff into the lungs daily. 07/13/18   Alba Cory, MD  Vitamin D, Ergocalciferol, (DRISDOL) 50000 units CAPS capsule Take 1 capsule (50,000 Units total) by mouth every 7 (seven) days. 12/06/17   Alba Cory, MD    Allergies Cozaar [losartan potassium]; Lipitor [atorvastatin]; and Zocor [simvastatin]  Family History  Problem Relation Age of Onset  . Heart disease Mother   . Heart attack Mother   . Heart disease Father   . Stroke Father   . Alcohol abuse Brother     Social History Social History   Tobacco Use  . Smoking status: Never Smoker  . Smokeless tobacco: Never Used  Substance Use Topics  . Alcohol use: No    Alcohol/week: 0.0 standard drinks  . Drug use: No    Review of Systems  Constitutional: Negative for fever. Eyes: Negative for visual changes. ENT: Negative for sore throat. Cardiovascular: Negative for chest pain. Respiratory: Negative for shortness of breath. Gastrointestinal: Negative for abdominal pain, vomiting and diarrhea. Genitourinary: Negative for dysuria. Musculoskeletal: Negative for back pain. Skin: Negative for rash. Skin tear Neurological: Negative for headaches, focal weakness or numbness. ____________________________________________  PHYSICAL EXAM:  VITAL SIGNS: ED Triage Vitals  Enc Vitals Group     BP 08/27/18 1358 (!) 162/81     Pulse Rate 08/27/18 1358 69     Resp 08/27/18 1358 18     Temp 08/27/18 1358 98.3 F (36.8 C)     Temp Source 08/27/18 1358 Oral     SpO2 08/27/18 1358 99 %     Weight 08/27/18 1359  149 lb (67.6 kg)     Height 08/27/18 1359 5\' 4"  (1.626 m)     Head Circumference --      Peak Flow --      Pain Score 08/27/18 1358 0     Pain Loc --      Pain Edu? --      Excl. in GC? --     Constitutional: Alert and oriented. Well appearing and in no distress. GCS = 15 Head: Normocephalic and atraumatic, except for a soft hematoma to the crown of the head. No abrasion, laceration, or ecchymosis. No Battle's sign.  Eyes: Conjunctivae are normal. PERRL. Normal extraocular movements. No periorbital ecchymosis or edema. Nose: No congestion/rhinorrhea/epistaxis. Mouth/Throat: Mucous membranes are moist. Neck: Supple. Normal ROM. No distracting midline tenderness.  Cardiovascular: Normal rate, regular rhythm. Normal distal pulses. Respiratory: Normal respiratory effort. No wheezes/rales/rhonchi. Gastrointestinal: Soft and nontender. No distention. Musculoskeletal: Nontender with normal range of motion in all extremities.  Neurologic:  Normal gait without ataxia. Normal speech and language. No gross focal neurologic deficits are appreciated. Skin:  Skin is warm, dry and intact. No rash noted. Skin tear over the left hand dorsum.  ____________________________________________   LABS (pertinent positives/negatives) Labs Reviewed  COMPREHENSIVE METABOLIC PANEL - Abnormal; Notable for the following components:      Result Value   Glucose, Bld 119 (*)    Creatinine, Ser 1.05 (*)    GFR calc non Af Amer 50 (*)    GFR calc Af Amer 58 (*)    All other components within normal limits  TROPONIN I - Abnormal; Notable for the following components:   Troponin I 0.33 (*)    All other components within normal limits  CBC WITH DIFFERENTIAL/PLATELET  ____________________________________________  EKG See EKG report ____________________________________________   RADIOLOGY  CT Head w/o CM  IMPRESSION: 1. No acute intracranial abnormality. 2. Hypodensity in the white matter is suggestive for  chronic small vessel ischemic disease. 3. Scalp hematoma along the top of the posterior head. No underlying fracture.  CXR  ____________________________________________  PROCEDURES  Tylenol 650 mg PO Tdap 0.5 ml IM  .Marland Kitchen.Laceration Repair Date/Time: 08/27/2018 3:16 PM Performed by: Lissa HoardMenshew, Wilgus Deyton V Bacon, PA-C Authorized by: Lissa HoardMenshew, Dezaria Methot V Bacon, PA-C   Consent:    Consent obtained:  Verbal   Consent given by:  Patient   Risks discussed:  Poor wound healing and infection   Alternatives discussed:  No treatment Anesthesia (see MAR for exact dosages):    Anesthesia method:  None Laceration details:    Location:  Hand   Hand location:  L hand, dorsum   Length (cm):  6   Depth (mm):  2 Repair type:    Repair type:  Simple Exploration:    Contaminated: no   Treatment:    Area cleansed with:  Soap and water   Amount of cleaning:  Standard Skin repair:    Repair method:  Tissue adhesive Approximation:    Approximation:  Close Post-procedure details:    Dressing:  Open (no dressing)   Patient  tolerance of procedure:  Tolerated well, no immediate complications  ____________________________________________  INITIAL IMPRESSION / ASSESSMENT AND PLAN / ED COURSE  DDX: hypoglycemia, arrhythmia, hypovolemia, seizures  Geriatric patient with ED evaluation of a witnessed syncopal episode.  Patient denies any preceding dizziness, weakness, take, or feeling of illness prior to the onset.  Patient was transported by EMS to the ED for further evaluation.  She has been alert and oriented during her tenure in the ED.  Labs are reassuring at this time as she did have a critical value troponin call back from the lab.  Given the patient's otherwise benign work-up, negative head CT, and normal EKG, she will be admitted to the hospital service for observation status. ____________________________________________  FINAL CLINICAL IMPRESSION(S) / ED DIAGNOSES  Final diagnoses:  Syncope and  collapse  Elevated troponin      Lissa Hoard, PA-C 08/27/18 1535    Phineas Semen, MD 08/28/18 [phone]

## 2018-08-27 NOTE — Consult Note (Signed)
ANTICOAGULATION CONSULT NOTE - Initial Consult  Pharmacy Consult for Heparin Drip Indication: chest pain/ACS/STEMI  Allergies  Allergen Reactions  . Cozaar [Losartan Potassium] Other (See Comments)    Gas   . Lipitor [Atorvastatin] Other (See Comments)    "full of water"  . Zocor [Simvastatin]     Patient Measurements: Height: 5\' 4"  (162.6 cm) Weight: 149 lb (67.6 kg) IBW/kg (Calculated) : 54.7 Heparin Dosing Weight: 67.6 kg  Vital Signs: Temp: 98.3 F (36.8 C) (04/24 1358) Temp Source: Oral (04/24 1358) BP: 159/66 (04/24 1630) Pulse Rate: 65 (04/24 1630)  Labs: Recent Labs    08/27/18 1405  HGB 13.5  HCT 42.1  PLT 169  CREATININE 1.05*  TROPONINI 0.33*    Estimated Creatinine Clearance: 40.4 mL/min (A) (by C-G formula based on SCr of 1.05 mg/dL (H)).   Medical History: Past Medical History:  Diagnosis Date  . Hyperlipidemia   . Hypertension   . Hypertensive CKD (chronic kidney disease)   . Hypothyroid   . Osteoporosis   . PVD (peripheral vascular disease) (HCC)     Medications:  No PTA anticoagulants listed  Assessment: Pharmacy has been consulted for Heparin initiation/dosing for ACS/STEMI  Goal of Therapy:  Heparin level 0.3-0.7 units/ml Monitor platelets by anticoagulation protocol: Yes   Plan:  Obtained baseline APTT and INR  Will dose 4000 unit heparin bolus, followed by 800 units/hour.  Will check Anti-Xa level in 8 hours per protocol and CBC's daily while on heparin drip.   Albina Billet, PharmD, BCPS Clinical Pharmacist 08/27/2018 5:10 PM

## 2018-08-27 NOTE — Progress Notes (Signed)
Advance care planning  Purpose of Encounter Syncope/non-ST elevation MI  Parties in Attendance Patient  Patients Decisional capacity Alert and oriented.  Able to make medical decisions.  No documented healthcare power of attorney.  Patient tells me her niece [name] would be making medical decisions if she is unable to.  Discussed in detail regarding non-ST elevation MI/syncope.  Treatment plan , prognosis discussed.  All questions answered  CODE STATUS discussed and patient wishes to be DNR/DNI.  Orders entered and CODE STATUS changed  DNR/DNI  Time spent - 17 minutes

## 2018-08-27 NOTE — ED Provider Notes (Signed)
Lurline Idol, attending physician, personally viewed and interpreted this EKG  EKG Time: 1355 Rate: 69 Rhythm: sinus rhythm Axis: normal Intervals: qtc 453 QRS: narrow, q waves V1, V2 ST changes: no st elevation Impression: abnormal Maryan Puls, MD 08/27/18 1446

## 2018-08-27 NOTE — ED Notes (Signed)
ED TO INPATIENT HANDOFF REPORT  ED Nurse Name and Phone #: Shanda Bumps [phone]   S Name/Age/Gender Kristin Juarez 83 y.o. female Room/Bed: ED16A/ED16A  Code Status   Code Status: DNR  Home/SNF/Other Home Patient oriented to: self, place, time and situation Is this baseline? Yes   Triage Complete: Triage complete  Chief Complaint Syncope   Triage Note Pt via EMS for syncopal episode in line at [employer], pt reports she is not in any pain but reports a bump on the back of her head. Laceration on the top of her left head, dressed with non adhesive and gauze. Laceration on left finger #3. Also a small laceration on finger # 2 left hand, bleeding controlled   Allergies Allergies  Allergen Reactions  . Cozaar [Losartan Potassium] Other (See Comments)    Gas   . Lipitor [Atorvastatin] Other (See Comments)    "full of water"  . Zocor [Simvastatin]     Level of Care/Admitting Diagnosis ED Disposition    ED Disposition Condition Comment   Admit  Hospital Area: [facility] [[id number]]  Level of Care: Telemetry [5]  Covid Evaluation: N/A  Diagnosis: NSTEMI (non-ST elevated myocardial infarction) [facility]) [[id number]]  Admitting Physician: Milagros Loll [[id number]]  Attending Physician: Milagros Loll [[id number]]  Estimated length of stay: past midnight tomorrow  Certification:: I certify this patient will need inpatient services for at least 2 midnights  PT Class (Do Not Modify): Inpatient [101]  PT Acc Code (Do Not Modify): Private [1]       B Medical/Surgery History Past Medical History:  Diagnosis Date  . Hyperlipidemia   . Hypertension   . Hypertensive CKD (chronic kidney disease)   . Hypothyroid   . Osteoporosis   . PVD (peripheral vascular disease) (HCC)    Past Surgical History:  Procedure Laterality Date  . ABDOMINAL HYSTERECTOMY    . CLEFT PALATE REPAIR       A IV Location/Drains/Wounds Patient Lines/Drains/Airways Status   Active  Line/Drains/Airways    Name:   Placement date:   Placement time:   Site:   Days:   Peripheral IV 08/27/18 Left Antecubital   08/27/18    1403    Antecubital   less than 1          Intake/Output Last 24 hours No intake or output data in the 24 hours ending 08/27/18 1717  Labs/Imaging Results for orders placed or performed during the hospital encounter of 08/27/18 (from the past 48 hour(s))  CBC with Differential     Status: None   Collection Time: 08/27/18  2:05 PM  Result Value Ref Range   WBC 6.4 4.0 - 10.5 K/uL   RBC 4.61 3.87 - 5.11 MIL/uL   Hemoglobin 13.5 12.0 - 15.0 g/dL   HCT 38.9 37.3 - 42.8 %   MCV 91.3 80.0 - 100.0 fL   MCH 29.3 26.0 - 34.0 pg   MCHC 32.1 30.0 - 36.0 g/dL   RDW 76.8 11.5 - 72.6 %   Platelets 169 150 - 400 K/uL   nRBC 0.0 0.0 - 0.2 %   Neutrophils Relative % 73 %   Neutro Abs 4.6 1.7 - 7.7 K/uL   Lymphocytes Relative 18 %   Lymphs Abs 1.2 0.7 - 4.0 K/uL   Monocytes Relative 6 %   Monocytes Absolute 0.4 0.1 - 1.0 K/uL   Eosinophils Relative 2 %   Eosinophils Absolute 0.1 0.0 - 0.5 K/uL   Basophils Relative 1 %   Basophils  Absolute 0.0 0.0 - 0.1 K/uL   Immature Granulocytes 0 %   Abs Immature Granulocytes 0.02 0.00 - 0.07 K/uL    Comment: Performed at [facility], [address] [place], [place] [address]  Comprehensive metabolic panel     Status: Abnormal   Collection Time: 08/27/18  2:05 PM  Result Value Ref Range   Sodium 144 135 - 145 mmol/L   Potassium 3.6 3.5 - 5.1 mmol/L   Chloride 108 98 - 111 mmol/L   CO2 26 22 - 32 mmol/L   Glucose, Bld 119 (H) 70 - 99 mg/dL   BUN 21 8 - 23 mg/dL   Creatinine, Ser 5.78 (H) 0.44 - 1.00 mg/dL   Calcium 9.0 8.9 - 46.9 mg/dL   Total Protein 7.1 6.5 - 8.1 g/dL   Albumin 3.8 3.5 - 5.0 g/dL   AST 19 15 - 41 U/L   ALT 11 0 - 44 U/L   Alkaline Phosphatase 86 38 - 126 U/L   Total Bilirubin 0.6 0.3 - 1.2 mg/dL   GFR calc non Af Amer 50 (L) >60 mL/min   GFR calc Af Amer 58 (L) >60 mL/min    Anion gap 10 5 - 15    Comment: Performed at [facility], [address] [place], [place] [address]  Troponin I - Once     Status: Abnormal   Collection Time: 08/27/18  2:05 PM  Result Value Ref Range   Troponin I 0.33 (HH) <0.03 ng/mL    Comment: CRITICAL RESULT CALLED TO, READ BACK BY AND VERIFIED WITH Leylani Duley AT 1507 08/27/2018 DAS Performed at [facility], [address] [place], [place] [address]    Dg Chest 1 View  Result Date: 08/27/2018 CLINICAL DATA:  Syncope EXAM: CHEST  1 VIEW COMPARISON:  None. FINDINGS: There is elevation of the right diaphragm. There is mild bilateral interstitial thickening likely chronic. There is no focal parenchymal opacity. There is no pleural effusion or pneumothorax. The heart and mediastinal contours are unremarkable. The osseous structures are unremarkable. IMPRESSION: No active disease. Electronically Signed   By: Elige Ko   On: 08/27/2018 15:39   Ct Head Wo Contrast  Result Date: 08/27/2018 CLINICAL DATA:  83 year old with syncope and fainting. EXAM: CT HEAD WITHOUT CONTRAST TECHNIQUE: Contiguous axial images were obtained from the base of the skull through the vertex without intravenous contrast. COMPARISON:  05/24/2011 FINDINGS: Brain: Mild cerebral atrophy. Hypodensity in the white matter is suggestive for chronic changes. No evidence for acute hemorrhage, mass lesion, midline shift, hydrocephalus or large infarct. Chronic asymmetry of the frontal horns. Vascular: No hyperdense vessel or unexpected calcification. Skull: Normal. Negative for fracture or focal lesion. Sinuses/Orbits: Mild mucosal disease in the frontal and ethmoid air cells. Mild mucosal disease in the maxillary sinuses. Other: Focal soft tissue swelling along the top of the posterior head. IMPRESSION: 1. No acute intracranial abnormality. 2. Hypodensity in the white matter is suggestive for chronic small vessel ischemic disease. 3. Scalp hematoma along  the top of the posterior head. No underlying fracture. Electronically Signed   By: Richarda Overlie M.D.   On: 08/27/2018 14:52    Pending Labs Unresulted Labs (From admission, onward)    Start     Ordered   08/28/18 0500  Basic metabolic panel  Tomorrow morning,   STAT     08/27/18 1703   08/28/18 0500  CBC  Tomorrow morning,   STAT     08/27/18 1703  08/28/18 0500  Lipid panel  Tomorrow morning,   STAT     08/27/18 1703   08/28/18 0100  Heparin level (unfractionated)  Once-Timed,   STAT     08/27/18 1707   08/27/18 1900  Troponin I - Now Then Q6H  Now then every 6 hours,   STAT     08/27/18 1709   08/27/18 1707  Protime-INR  ONCE - STAT,   STAT     08/27/18 1706   08/27/18 1707  APTT  ONCE - STAT,   STAT     08/27/18 1706   08/27/18 1703  TSH  Add-on,   AD     08/27/18 1703          Vitals/Pain Today's Vitals   08/27/18 1615 08/27/18 1630 08/27/18 1630 08/27/18 1650  BP:  (!) 159/66    Pulse: 65 65    Resp:  (!) 21    Temp:      TempSrc:      SpO2: 96% 94%    Weight:      Height:      PainSc:   0-No pain 0-No pain    Isolation Precautions No active isolations  Medications Medications  sodium chloride flush (NS) 0.9 % injection 3 mL (has no administration in time range)  acetaminophen (TYLENOL) tablet 650 mg (has no administration in time range)    Or  acetaminophen (TYLENOL) suppository 650 mg (has no administration in time range)  polyethylene glycol (MIRALAX / GLYCOLAX) packet 17 g (has no administration in time range)  ondansetron (ZOFRAN) tablet 4 mg (has no administration in time range)    Or  ondansetron (ZOFRAN) injection 4 mg (has no administration in time range)  albuterol (PROVENTIL) (2.5 MG/3ML) 0.083% nebulizer solution 2.5 mg (has no administration in time range)  aspirin EC tablet 81 mg (has no administration in time range)  heparin bolus via infusion 4,000 Units (has no administration in time range)    Followed by  heparin ADULT infusion 100  units/mL (25000 units/24250mL sodium chloride 0.45%) (has no administration in time range)  Tdap (BOOSTRIX) injection 0.5 mL (0.5 mLs Intramuscular Given 08/27/18 1445)  acetaminophen (TYLENOL) tablet 650 mg (650 mg Oral Given 08/27/18 1523)    Mobility walks Low fall risk   Focused Assessments 1   R Recommendations: See Admitting Provider Note  Report given to:   Additional Notes:

## 2018-08-27 NOTE — Progress Notes (Signed)
Kristin Juarez was admitted to 237 via stretcher accompanied by RN from the ED. Marland Kitchen She is Awake, alert, and oriented and denies pain. No CP or SOB. States feels back to normal. Heparin infusing at 800 units per hour. Pt was oriented to room and surroundings. Bed alarm on for safety. Telemetry in place.

## 2018-08-27 NOTE — ED Notes (Signed)
Lab called to inform me of a troponin of 0.33, Jenise PA notified

## 2018-08-27 NOTE — ED Notes (Signed)
Pt given phone and number for [employer].

## 2018-08-28 ENCOUNTER — Inpatient Hospital Stay
Admit: 2018-08-28 | Discharge: 2018-08-28 | Disposition: A | Discharge status: Home / Self Care | Payer: 59 | Attending: Internal Medicine | Admitting: Internal Medicine

## 2018-08-28 DIAGNOSIS — I361 Nonrheumatic tricuspid (valve) insufficiency: Secondary | ICD-10-CM

## 2018-08-28 DIAGNOSIS — R7989 Other specified abnormal findings of blood chemistry: Secondary | ICD-10-CM

## 2018-08-28 DIAGNOSIS — R55 Syncope and collapse: Secondary | ICD-10-CM

## 2018-08-28 LAB — BASIC METABOLIC PANEL
Anion gap: 8 (ref 5–15)
BUN: 22 mg/dL (ref 8–23)
CO2: 25 mmol/L (ref 22–32)
Calcium: 8.9 mg/dL (ref 8.9–10.3)
Chloride: 110 mmol/L (ref 98–111)
Creatinine, Ser: 0.87 mg/dL (ref 0.44–1.00)
GFR calc Af Amer: >60 mL/min (ref >60)
GFR calc non Af Amer: >60 mL/min (ref >60)
Glucose, Bld: 98 mg/dL (ref 70–99)
Potassium: 3.4 mmol/L — ABNORMAL LOW (ref 3.5–5.1)
Sodium: 143 mmol/L (ref 135–145)

## 2018-08-28 LAB — ECHOCARDIOGRAM COMPLETE
Height: 62 in
Weight: 2878.33 oz

## 2018-08-28 LAB — CBC
HCT: 37.4 % (ref 36.0–46.0)
Hemoglobin: 12.1 g/dL (ref 12.0–15.0)
MCH: 29.4 pg (ref 26.0–34.0)
MCHC: 32.4 g/dL (ref 30.0–36.0)
MCV: 91 fL (ref 80.0–100.0)
Platelets: 144 10*3/uL — ABNORMAL LOW (ref 150–400)
RBC: 4.11 MIL/uL (ref 3.87–5.11)
RDW: 13 % (ref 11.5–15.5)
WBC: 5.6 10*3/uL (ref 4.0–10.5)
nRBC: 0 % (ref 0.0–0.2)

## 2018-08-28 LAB — TROPONIN I: Troponin I: 0.69 ng/mL (ref <0.03)

## 2018-08-28 LAB — LIPID PANEL
Cholesterol: 157 mg/dL (ref 0–200)
HDL: 38 mg/dL — ABNORMAL LOW (ref >40)
LDL Cholesterol: 108 mg/dL — ABNORMAL HIGH (ref 0–99)
Total CHOL/HDL Ratio: 4.1 RATIO
Triglycerides: 57 mg/dL (ref <150)
VLDL: 11 mg/dL (ref 0–40)

## 2018-08-28 LAB — HEPARIN LEVEL (UNFRACTIONATED)
Heparin Unfractionated: 0.54 IU/mL (ref 0.30–0.70)
Heparin Unfractionated: 0.35 IU/mL (ref 0.30–0.70)

## 2018-08-28 MED ORDER — ATORVASTATIN CALCIUM 20 MG PO TABS
80.0000 mg | ORAL_TABLET | Freq: Every day | ORAL | Status: DC
  Administered 2018-08-28: 80 mg via ORAL
  Filled 2018-08-28: qty 4

## 2018-08-28 MED ORDER — AMLODIPINE BESYLATE 10 MG PO TABS
10.0000 mg | ORAL_TABLET | Freq: Every day | ORAL | Status: DC
  Administered 2018-08-28 – 2018-08-29 (×2): 10 mg via ORAL
  Filled 2018-08-28 (×2): qty 1

## 2018-08-28 NOTE — Plan of Care (Signed)
  Problem: Activity: Goal: Risk for activity intolerance will decrease Outcome: Progressing   Problem: Safety: Goal: Ability to remain free from injury will improve Outcome: Progressing  Low bed Mats

## 2018-08-28 NOTE — Consult Note (Signed)
ANTICOAGULATION CONSULT NOTE - Initial Consult  Pharmacy Consult for Heparin Drip Indication: chest pain/ACS/STEMI  Allergies  Allergen Reactions  . Cozaar [Losartan Potassium] Other (See Comments)    Gas   . Lipitor [Atorvastatin] Other (See Comments)    "full of water"  . Zocor [Simvastatin]     Patient Measurements: Height: 5\' 2"  (157.5 cm) Weight: 179 lb 9.6 oz (81.5 kg) IBW/kg (Calculated) : 50.1 Heparin Dosing Weight: 67.6 kg  Vital Signs: Temp: 98.2 F (36.8 C) (04/24 2005) Temp Source: Oral (04/24 2005) BP: 134/71 (04/24 2005) Pulse Rate: 61 (04/24 2005)  Labs: Recent Labs    08/27/18 1405 08/27/18 1827 08/28/18 0125  HGB 13.5  --  12.1  HCT 42.1  --  37.4  PLT 169  --  144*  APTT  --  138*  --   LABPROT  --  14.2  --   INR  --  1.1  --   HEPARINUNFRC  --   --  0.54  CREATININE 1.05*  --  0.87  TROPONINI 0.33* 0.83*  --     Estimated Creatinine Clearance: 51 mL/min (by C-G formula based on SCr of 0.87 mg/dL).   Medical History: Past Medical History:  Diagnosis Date  . Hyperlipidemia   . Hypertension   . Hypertensive CKD (chronic kidney disease)   . Hypothyroid   . Osteoporosis   . PVD (peripheral vascular disease) (HCC)     Medications:  No PTA anticoagulants listed  Assessment: Pharmacy has been consulted for Heparin initiation/dosing for ACS/STEMI  Goal of Therapy:  Heparin level 0.3-0.7 units/ml Monitor platelets by anticoagulation protocol: Yes   Plan:  04/25 @ 0125 HL 0.54 therapeutic. Will continue current rate and will recheck HL @ 0900, CBC stable will continue to monitor.   Thomasene Ripple, PharmD, BCPS Clinical Pharmacist 08/28/2018 2:26 AM

## 2018-08-28 NOTE — Progress Notes (Signed)
[facility] [place] [facility]   PATIENT NAME: Kristin ClauseDarlene Juarez    MR#:  [id number]  DATE OF BIRTH:  [birth year]  SUBJECTIVE:  CHIEF COMPLAINT:   Chief Complaint  Patient presents with  . Loss of Consciousness   [facility] with [facility]  Denies any associated chest pain, palpitation, shortness of breath. No complaints today. Troponin was noted to be elevated.  On heparin drip.  REVIEW OF SYSTEMS:  CONSTITUTIONAL: No fever, fatigue or weakness.  EYES: No blurred or double vision.  EARS, NOSE, AND THROAT: No tinnitus or ear pain.  RESPIRATORY: No cough, shortness of breath, wheezing or hemoptysis.  CARDIOVASCULAR: No chest pain, orthopnea, edema.  GASTROINTESTINAL: No nausea, vomiting, diarrhea or abdominal pain.  GENITOURINARY: No dysuria, hematuria.  ENDOCRINE: No polyuria, nocturia,  HEMATOLOGY: No anemia, easy bruising or bleeding SKIN: No rash or lesion. MUSCULOSKELETAL: No joint pain or arthritis.   NEUROLOGIC: No tingling, numbness, weakness.  PSYCHIATRY: No anxiety or depression.   ROS  DRUG ALLERGIES:   Allergies  Allergen Reactions  . Cozaar [Losartan Potassium] Other (See Comments)    Gas   . Lipitor [Atorvastatin] Other (See Comments)    "full of water"  . Zocor [Simvastatin]     VITALS:  Blood pressure 126/67, pulse 69, temperature 98.3 F (36.8 C), temperature source Oral, resp. rate 19, height 5\' 2"  (1.575 m), weight 81.6 kg, SpO2 95 %.  PHYSICAL EXAMINATION:  GENERAL:  83 y.o.-year-old patient lying in the bed with no acute distress.  EYES: Pupils equal, round, reactive to light and accommodation. No scleral icterus. Extraocular muscles intact.  HEENT: Head atraumatic, normocephalic. Oropharynx and nasopharynx clear.  NECK:  Supple, no jugular venous distention. No thyroid enlargement, no tenderness.  LUNGS: Normal breath sounds bilaterally, no wheezing, rales,rhonchi or crepitation. No use of  accessory muscles of respiration.  CARDIOVASCULAR: S1, S2 normal. No murmurs, rubs, or gallops.  ABDOMEN: Soft, nontender, nondistended. Bowel sounds present. No organomegaly or mass.  EXTREMITIES: No pedal edema, cyanosis, or clubbing.  NEUROLOGIC: Cranial nerves II through XII are intact. Muscle strength 5/5 in all extremities. Sensation intact. Gait not checked.  PSYCHIATRIC: The patient is alert and oriented x 3.  SKIN: No obvious rash, lesion, or ulcer.   Physical Exam LABORATORY PANEL:   CBC Recent Labs  Lab 08/28/18 0125  WBC 5.6  HGB 12.1  HCT 37.4  PLT 144*   ------------------------------------------------------------------------------------------------------------------  Chemistries  Recent Labs  Lab 08/27/18 1405 08/28/18 0125  NA 144 143  K 3.6 3.4*  CL 108 110  CO2 26 25  GLUCOSE 119* 98  BUN 21 22  CREATININE 1.05* 0.87  CALCIUM 9.0 8.9  AST 19  --   ALT 11  --   ALKPHOS 86  --   BILITOT 0.6  --    ------------------------------------------------------------------------------------------------------------------  Cardiac Enzymes Recent Labs  Lab 08/27/18 1827 08/28/18 0125  TROPONINI 0.83* 0.69*   ------------------------------------------------------------------------------------------------------------------  RADIOLOGY:  Dg Chest 1 View  Result Date: 08/27/2018 CLINICAL DATA:  Syncope EXAM: CHEST  1 VIEW COMPARISON:  None. FINDINGS: There is elevation of the right diaphragm. There is mild bilateral interstitial thickening likely chronic. There is no focal parenchymal opacity. There is no pleural effusion or pneumothorax. The heart and mediastinal contours are unremarkable. The osseous structures are unremarkable. IMPRESSION: No active disease. Electronically Signed   By: Elige KoHetal  Patel   On: 08/27/2018 15:39   Ct Head Wo Contrast  Result Date:  08/27/2018 CLINICAL DATA:  83 year old with syncope and fainting. EXAM: CT HEAD WITHOUT CONTRAST  TECHNIQUE: Contiguous axial images were obtained from the base of the skull through the vertex without intravenous contrast. COMPARISON:  05/24/2011 FINDINGS: Brain: Mild cerebral atrophy. Hypodensity in the white matter is suggestive for chronic changes. No evidence for acute hemorrhage, mass lesion, midline shift, hydrocephalus or large infarct. Chronic asymmetry of the frontal horns. Vascular: No hyperdense vessel or unexpected calcification. Skull: Normal. Negative for fracture or focal lesion. Sinuses/Orbits: Mild mucosal disease in the frontal and ethmoid air cells. Mild mucosal disease in the maxillary sinuses. Other: Focal soft tissue swelling along the top of the posterior head. IMPRESSION: 1. No acute intracranial abnormality. 2. Hypodensity in the white matter is suggestive for chronic small vessel ischemic disease. 3. Scalp hematoma along the top of the posterior head. No underlying fracture. Electronically Signed   By: Richarda Overlie M.D.   On: 08/27/2018 14:52    ASSESSMENT AND PLAN:   Active Problems:   NSTEMI (non-ST elevated myocardial infarction) (HCC)   * Non-ST elevation MI.  Patient had syncopal episode with significant elevation of troponin   No prior cardiac history.  No chest pain or shortness of breath.  Most likely MI or arrhythmia.   On heparin drip and repeat troponin went up but coming down now.   Start aspirin. Consult cardiology. Echocardiogram ordered. Patient was on rosuvastatin, LDL is more than 100, changed to atorvastatin 80 mg.  * Syncope. Differentials include arrhythmia versus MI.  See above.  * Hypothyroidism.  Significantly elevated TSH on prior labs.  Will restart patient's home dose of levothyroxine.    TSH 4.5.  * Hypertension.  Continue medications.  * DVT prophylaxis.  On heparin drip.   All the records are reviewed and case discussed with Care Management/Social Workerr. Management plans discussed with the patient, family and they are in  agreement.  CODE STATUS: DNR.  TOTAL TIME TAKING CARE OF THIS PATIENT: 35 minutes.     POSSIBLE D/C IN 1-2 DAYS, DEPENDING ON CLINICAL CONDITION.   Altamese Dilling M.D on 08/28/2018   Between 7am to 6pm - Pager - [phone]  After 6pm go to [email or web address] - Social research officer, government  [facility]  Office  [phone]  CC: Primary care physician; Alba Cory, MD  Note: This dictation was prepared with Dragon dictation along with smaller phrase technology. Any transcriptional errors that result from this process are unintentional.

## 2018-08-28 NOTE — Consult Note (Signed)
ANTICOAGULATION CONSULT NOTE - Follow up Consult  Pharmacy Consult for Heparin Drip Indication: chest pain/ACS/STEMI  Allergies  Allergen Reactions  . Cozaar [Losartan Potassium] Other (See Comments)    Gas   . Lipitor [Atorvastatin] Other (See Comments)    "full of water"  . Zocor [Simvastatin]     Patient Measurements: Height: 5\' 2"  (157.5 cm) Weight: 179 lb 14.3 oz (81.6 kg) IBW/kg (Calculated) : 50.1 Heparin Dosing Weight: 68.3 kg  Vital Signs: Temp: 98.5 F (36.9 C) (04/25 0737) Temp Source: Oral (04/25 0737) BP: 189/89 (04/25 0741) Pulse Rate: 77 (04/25 0741)  Labs: Recent Labs    08/27/18 1405 08/27/18 1827 08/28/18 0125 08/28/18 0925  HGB 13.5  --  12.1  --   HCT 42.1  --  37.4  --   PLT 169  --  144*  --   APTT  --  138*  --   --   LABPROT  --  14.2  --   --   INR  --  1.1  --   --   HEPARINUNFRC  --   --  0.54 0.35  CREATININE 1.05*  --  0.87  --   TROPONINI 0.33* 0.83* 0.69*  --     Estimated Creatinine Clearance: 51 mL/min (by C-G formula based on SCr of 0.87 mg/dL).  Assessment: Pharmacy has been consulted for Heparin initiation/dosing for ACS/STEMI  Goal of Therapy:  Heparin level 0.3-0.7 units/ml Monitor platelets by anticoagulation protocol: Yes   Plan:  04/25 @ 0925 HL 0.35 therapeutic x2. Will continue current rate and will recheck HL in AM with CBC. Will continue to monitor CBC.     Marty Heck, PharmD, BCPS Clinical Pharmacist 08/28/2018 10:36 AM

## 2018-08-28 NOTE — Consult Note (Signed)
Cardiology Consultation:   Patient ID: Kristin ClauseDarlene Longhi MRN: [id number]; DOB: [birth year]  Admit date: 08/27/2018 Date of Consult: 08/28/2018  Primary Care Provider: Alba CorySowles, Krichna, MD Primary Cardiologist: New   Patient Profile:   Kristin Juarez is a 83 y.o. female with a hx of HTN who is being seen today for the evaluation of syncope and elevated troponin  at the request of Dr Lisabeth DevoidSudni  History of Present Illness:   Kristin Juarez is an 83 yo with hx of HTN, HL, PV dz   Admitted yesterday after syncopal spell    The patient lives alone   She was in her usual state of health   Walker KehrYeasterday was doing chores around the house   Felt OK    Ate Drank as normally does    Hospital doctorWent to store   Felt OK   No CP   No SOB  No dizziness  No palpitaitons   No weakness NO pain.   Pt Felt OK prior  Denies CP   No SOB  Was in check out line the on floor   No other history    Sent to ED Feels OK now   Never had before   No recnet illness   No Cough  No fever    Pt had echo in 2016 LVEF 60 to 65%  Mild TR    Past Medical History:  Diagnosis Date   Hyperlipidemia    Hypertension    Hypertensive CKD (chronic kidney disease)    Hypothyroid    Osteoporosis    PVD (peripheral vascular disease) (HCC)     Past Surgical History:  Procedure Laterality Date   ABDOMINAL HYSTERECTOMY     CLEFT PALATE REPAIR         Inpatient Medications: Scheduled Meds:  amLODipine  10 mg Oral Daily   aspirin EC  81 mg Oral Daily   irbesartan  300 mg Oral Daily   And   hydrochlorothiazide  25 mg Oral Daily   levothyroxine  75 mcg Oral Q0600   rosuvastatin  40 mg Oral Daily   sodium chloride flush  3 mL Intravenous Q12H   umeclidinium bromide  1 puff Inhalation Daily   Continuous Infusions:  heparin 800 Units/hr (08/27/18 1744)   PRN Meds: acetaminophen **OR** acetaminophen, albuterol, hydrALAZINE, nitroGLYCERIN, ondansetron **OR** ondansetron (ZOFRAN) IV, polyethylene glycol  Allergies:    Allergies   Allergen Reactions   Cozaar [Losartan Potassium] Other (See Comments)    Gas    Lipitor [Atorvastatin] Other (See Comments)    "full of water"   Zocor [Simvastatin]     Social History:   Social History   Socioeconomic History   Marital status: Single    Spouse name: Not on file   Number of children: 0   Years of education: Not on file   Highest education level: Bachelor's degree (e.g., BA, AB, BS)  Occupational History   Not on file  Social Needs   Financial resource strain: Not hard at all   Food insecurity:    Worry: Never true    Inability: Never true   Transportation needs:    Medical: No    Non-medical: No  Tobacco Use   Smoking status: Never Smoker   Smokeless tobacco: Never Used  Substance and Sexual Activity   Alcohol use: No    Alcohol/week: 0.0 standard drinks   Drug use: No   Sexual activity: Never  Lifestyle   Physical activity:    Days per week: 7 days  Minutes per session: Not on file   Stress: Not at all  Relationships   Social connections:    Talks on phone: Once a week    Gets together: Never    Attends religious service: Never    Active member of club or organization: No    Attends meetings of clubs or organizations: Never    Relationship status: Never married   Intimate partner violence:    Fear of current or ex partner: No    Emotionally abused: No    Physically abused: No    Forced sexual activity: No  Other Topics Concern   Not on file  Social History Narrative   Her niece moved in with her in 2018    Family History:    Family History  Problem Relation Age of Onset   Heart disease Mother    Heart attack Mother    Heart disease Father    Stroke Father    Alcohol abuse Brother      ROS:  Please see the history of present illness.   All other ROS reviewed and negative.     Physical Exam/Data:   Vitals:   08/28/18 0737 08/28/18 0739 08/28/18 0740 08/28/18 0741  BP: (!) 155/76 (!) 195/75 (!)  186/92 (!) 189/89  Pulse: 68 73 79 77  Resp: 19     Temp: 98.5 F (36.9 C)     TempSrc: Oral     SpO2: 94%     Weight:      Height:        Intake/Output Summary (Last 24 hours) at 08/28/2018 1209 Last data filed at 08/28/2018 1018 Gross per 24 hour  Intake 360 ml  Output 400 ml  Net -40 ml   Last 3 Weights 08/28/2018 08/27/2018 08/27/2018  Weight (lbs) 179 lb 14.3 oz 179 lb 9.6 oz 149 lb  Weight (kg) 81.6 kg 81.466 kg 67.586 kg     Body mass index is 32.9 kg/m.  General:  Well nourished, well developed, in no acute distress HEENT: normal Lymph: no adenopathy Neck: no JVD Endocrine:  No thryomegaly Vascular: No carotid bruits; FA pulses 2+ bilaterally without bruits  Cardiac:  normal S1, S2; RRR; no murmur  Lungs:  clear to auscultation bilaterally, no wheezing, rhonchi or rales  Abd: soft, nontender, no hepatomegaly  Ext: 1+ edema  Sl erythema to legs   Chronic   2+ pulses   Musculoskeletal:  No deformities, BUE and BLE strength normal and equal Skin: warm and dry  Neuro:  CNs 2-12 intact, no focal abnormalities noted Psych:  Normal affect   EKG:  The EKG was personally reviewed and demonstrates:  SR   69   Anteroseptal MI Telemetry:  Telemetry was personally reviewed and demonstrates:  Sinus rhythm  Relevant CV Studies:  Echo today   LVEF and RVEF are normal   No signif valve abnormalities Laboratory Data:  Chemistry Recent Labs  Lab 08/27/18 1405 08/28/18 0125  NA 144 143  K 3.6 3.4*  CL 108 110  CO2 26 25  GLUCOSE 119* 98  BUN 21 22  CREATININE 1.05* 0.87  CALCIUM 9.0 8.9  GFRNONAA 50* >60  GFRAA 58* >60  ANIONGAP 10 8    Recent Labs  Lab 08/27/18 1405  PROT 7.1  ALBUMIN 3.8  AST 19  ALT 11  ALKPHOS 86  BILITOT 0.6   Hematology Recent Labs  Lab 08/27/18 1405 08/28/18 0125  WBC 6.4 5.6  RBC 4.61 4.11  HGB  13.5 12.1  HCT 42.1 37.4  MCV 91.3 91.0  MCH 29.3 29.4  MCHC 32.1 32.4  RDW 13.0 13.0  PLT 169 144*   Cardiac Enzymes Recent  Labs  Lab 08/27/18 1405 08/27/18 1827 08/28/18 0125  TROPONINI 0.33* 0.83* 0.69*   No results for input(s): TROPIPOC in the last 168 hours.  BNPNo results for input(s): BNP, PROBNP in the last 168 hours.  DDimer No results for input(s): DDIMER in the last 168 hours.  Radiology/Studies:  Dg Chest 1 View  Result Date: 08/27/2018 CLINICAL DATA:  Syncope EXAM: CHEST  1 VIEW COMPARISON:  None. FINDINGS: There is elevation of the right diaphragm. There is mild bilateral interstitial thickening likely chronic. There is no focal parenchymal opacity. There is no pleural effusion or pneumothorax. The heart and mediastinal contours are unremarkable. The osseous structures are unremarkable. IMPRESSION: No active disease. Electronically Signed   By: Elige Ko   On: 08/27/2018 15:39   Ct Head Wo Contrast  Result Date: 08/27/2018 CLINICAL DATA:  83 year old with syncope and fainting. EXAM: CT HEAD WITHOUT CONTRAST TECHNIQUE: Contiguous axial images were obtained from the base of the skull through the vertex without intravenous contrast. COMPARISON:  05/24/2011 FINDINGS: Brain: Mild cerebral atrophy. Hypodensity in the white matter is suggestive for chronic changes. No evidence for acute hemorrhage, mass lesion, midline shift, hydrocephalus or large infarct. Chronic asymmetry of the frontal horns. Vascular: No hyperdense vessel or unexpected calcification. Skull: Normal. Negative for fracture or focal lesion. Sinuses/Orbits: Mild mucosal disease in the frontal and ethmoid air cells. Mild mucosal disease in the maxillary sinuses. Other: Focal soft tissue swelling along the top of the posterior head. IMPRESSION: 1. No acute intracranial abnormality. 2. Hypodensity in the white matter is suggestive for chronic small vessel ischemic disease. 3. Scalp hematoma along the top of the posterior head. No underlying fracture. Electronically Signed   By: Richarda Overlie M.D.   On: 08/27/2018 14:52    Assessment and Plan:    83 yo with hx of HTN   PResents with syncope   No prodrome    Pt asymptomatic now I have reviewed echo   LVEF and RVEF are normal I would recomm checking orthostatics Would recomm carotid USN though yield low    Pt denies symptoms to sugg angina       Continue telemetry Will need monitor  2  Hx elevated troponin  Trivial elevation.   No CP  Echo with normal LVEF and no signif wall motion abnormalies  May reflect subendo ischemia in 83 yo in setting of  hypotension   I would not push ischemic testing at present with no other Hx  Can stop heparin    3Hx HTN   Follow  Check orthostatcs first before pushing treatment  4    HL   On statin  5   Hx PVOD  Some plaquing noted on abdominal CT   CT with chronic small vessel changes     6  Edema  Mild lower extremity edema   Appears chronic.    Pt lives alone  No family here to review      For questions or updates, please contact [facility] Please consult [email or web address] for contact info under     Signed, Dietrich Pates, MD  08/28/2018 12:09 PM

## 2018-08-29 ENCOUNTER — Inpatient Hospital Stay: Payer: 59

## 2018-08-29 LAB — CBC
HCT: 38 % (ref 36.0–46.0)
Hemoglobin: 12.3 g/dL (ref 12.0–15.0)
MCH: 29.3 pg (ref 26.0–34.0)
MCHC: 32.4 g/dL (ref 30.0–36.0)
MCV: 90.5 fL (ref 80.0–100.0)
Platelets: 161 10*3/uL (ref 150–400)
RBC: 4.2 MIL/uL (ref 3.87–5.11)
RDW: 12.7 % (ref 11.5–15.5)
WBC: 5.5 10*3/uL (ref 4.0–10.5)
nRBC: 0 % (ref 0.0–0.2)

## 2018-08-29 MED ORDER — ATORVASTATIN CALCIUM 80 MG PO TABS: 80.0000 mg | ORAL_TABLET | Freq: Every day | ORAL | 0 refills | Status: DC

## 2018-08-29 MED ORDER — AMLODIPINE BESYLATE 10 MG PO TABS: 10.0000 mg | ORAL_TABLET | Freq: Every day | ORAL | 0 refills | Status: DC

## 2018-08-29 NOTE — Progress Notes (Signed)
Progress Note  Patient Name: Kristin Juarez Date of Encounter: 08/29/2018  Primary Cardiologist: New  Subjective   No dizziness  No cough  N o SOB  No CP    Inpatient Medications    Scheduled Meds:  amLODipine  10 mg Oral Daily   aspirin EC  81 mg Oral Daily   atorvastatin  80 mg Oral q1800   irbesartan  300 mg Oral Daily   And   hydrochlorothiazide  25 mg Oral Daily   levothyroxine  75 mcg Oral Q0600   sodium chloride flush  3 mL Intravenous Q12H   umeclidinium bromide  1 puff Inhalation Daily   Continuous Infusions:  PRN Meds: acetaminophen **OR** acetaminophen, albuterol, hydrALAZINE, nitroGLYCERIN, ondansetron **OR** ondansetron (ZOFRAN) IV, polyethylene glycol   Vital Signs    Vitals:   08/28/18 2004 08/28/18 2006 08/29/18 0509 08/29/18 0727  BP: 140/69 139/81 (!) 142/70 (!) 152/75  Pulse: 67 79 66 64  Resp: 20 20 16    Temp:   98.2 F (36.8 C) 98.2 F (36.8 C)  TempSrc:   Oral Oral  SpO2: 95% 96% 95% 93%  Weight:   81.2 kg   Height:        Intake/Output Summary (Last 24 hours) at 08/29/2018 1052 Last data filed at 08/29/2018 0645 Gross per 24 hour  Intake 209.73 ml  Output 1275 ml  Net -1065.27 ml   Last 3 Weights 08/29/2018 08/28/2018 08/27/2018  Weight (lbs) 179 lb 0.2 oz 179 lb 14.3 oz 179 lb 9.6 oz  Weight (kg) 81.2 kg 81.6 kg 81.466 kg      Telemetry     SR   Short burst of PAT- Personally Reviewed  ECG      Physical Exam   GEN: No acute distress.   Neck: No JVD Cardiac: RRR, no murmurs, rubs, or gallops.  Respiratory: Clear to auscultation bilaterally. GI: Soft, nontender, non-distended  MS: 1+  edema; No deformity. Neuro:  Nonfocal  Psych: Normal affect   Labs    Chemistry Recent Labs  Lab 08/27/18 1405 08/28/18 0125  NA 144 143  K 3.6 3.4*  CL 108 110  CO2 26 25  GLUCOSE 119* 98  BUN 21 22  CREATININE 1.05* 0.87  CALCIUM 9.0 8.9  PROT 7.1  --   ALBUMIN 3.8  --   AST 19  --   ALT 11  --   ALKPHOS 86  --     BILITOT 0.6  --   GFRNONAA 50* >60  GFRAA 58* >60  ANIONGAP 10 8     Hematology Recent Labs  Lab 08/27/18 1405 08/28/18 0125 08/29/18 0347  WBC 6.4 5.6 5.5  RBC 4.61 4.11 4.20  HGB 13.5 12.1 12.3  HCT 42.1 37.4 38.0  MCV 91.3 91.0 90.5  MCH 29.3 29.4 29.3  MCHC 32.1 32.4 32.4  RDW 13.0 13.0 12.7  PLT 169 144* 161    Cardiac Enzymes Recent Labs  Lab 08/27/18 1405 08/27/18 1827 08/28/18 0125  TROPONINI 0.33* 0.83* 0.69*   No results for input(s): TROPIPOC in the last 168 hours.   BNPNo results for input(s): BNP, PROBNP in the last 168 hours.   DDimer No results for input(s): DDIMER in the last 168 hours.   Radiology    Dg Chest 1 View  Result Date: 08/27/2018 CLINICAL DATA:  Syncope EXAM: CHEST  1 VIEW COMPARISON:  None. FINDINGS: There is elevation of the right diaphragm. There is mild bilateral interstitial thickening likely chronic. There  is no focal parenchymal opacity. There is no pleural effusion or pneumothorax. The heart and mediastinal contours are unremarkable. The osseous structures are unremarkable. IMPRESSION: No active disease. Electronically Signed   By: Elige KoHetal  Patel   On: 08/27/2018 15:39   Ct Head Wo Contrast  Result Date: 08/27/2018 CLINICAL DATA:  83 year old with syncope and fainting. EXAM: CT HEAD WITHOUT CONTRAST TECHNIQUE: Contiguous axial images were obtained from the base of the skull through the vertex without intravenous contrast. COMPARISON:  05/24/2011 FINDINGS: Brain: Mild cerebral atrophy. Hypodensity in the white matter is suggestive for chronic changes. No evidence for acute hemorrhage, mass lesion, midline shift, hydrocephalus or large infarct. Chronic asymmetry of the frontal horns. Vascular: No hyperdense vessel or unexpected calcification. Skull: Normal. Negative for fracture or focal lesion. Sinuses/Orbits: Mild mucosal disease in the frontal and ethmoid air cells. Mild mucosal disease in the maxillary sinuses. Other: Focal soft tissue  swelling along the top of the posterior head. IMPRESSION: 1. No acute intracranial abnormality. 2. Hypodensity in the white matter is suggestive for chronic small vessel ischemic disease. 3. Scalp hematoma along the top of the posterior head. No underlying fracture. Electronically Signed   By: Richarda OverlieAdam  Henn M.D.   On: 08/27/2018 14:52    Cardiac Studies  Echo:  Normal LVEF /RVEF  Carotid USN IMPRESSION: Mild plaque at the level of the right carotid bulb and proximal right ICA. Mild to moderate plaque in the left carotid bulb and mild plaque in the proximal left ICA. No significant stenosis identified with estimated bilateral ICA stenoses of less than 50%. LVEF normal       Patient Profile     83 y.o. female Hx of HTN   Had syncopal episode at grocery store  Assessment & Plan    1  Syncope  Echo normal LVEF/RVEF   Orthostatics negative   Carotids with mild plaquing   Tele  Unremarkable     Will set up for outpt Zio patch for rhythm monitoring    And outpt f/u in clinic  Told pt no driving for 6 months unless identifiable cause found      OK to d/c from cardiac standpoint   Unfort pt lives alone and as no relatives in state   Works at assisted living   Will need excuse for driving   2  Trop   Triv elevation   May reflect hyppotensive spell   I am not convinced of active ischemia   Normal LVEF   Will follow as outpt  3  HTN   Continue meds   4  LE edema  Probably venous stasis   Normal LVEf   For questions or updates, please contact [facility] Please consult [email or web address] for contact info under        Signed, Dietrich PatesPaula Blane Worthington, MD  08/29/2018, 10:52 AM

## 2018-08-29 NOTE — Progress Notes (Signed)
Patient discharged home today via [employer] with voucher, IV's removed, medication and follow up education and instructions provided. VSS, no complaints at this time.

## 2018-08-30 ENCOUNTER — Telehealth: Payer: Self-pay

## 2018-08-30 ENCOUNTER — Telehealth: Payer: Self-pay | Admitting: *Deleted

## 2018-08-30 DIAGNOSIS — R55 Syncope and collapse: Secondary | ICD-10-CM

## 2018-08-30 NOTE — Telephone Encounter (Signed)
Copy fwd scheduling pool

## 2018-08-30 NOTE — Telephone Encounter (Signed)
-----   Message from Sondra Barges, PA-C sent at 08/30/2018  8:07 AM EDT ----- Please see Dr. Charlott Rakes comments.  ----- Message ----- From: Pricilla Riffle, MD Sent: 08/29/2018   8:04 PM EDT To: Pricilla Riffle, MD, Sondra Barges, PA-C  Pt needs  A follow up televisit in 1 wk Also needs an event monitor 4 wk mailed to house Hx os syncopal spell

## 2018-08-30 NOTE — Telephone Encounter (Signed)
Recent hospital admission for syncope. Order request from Eula Listen, [place].   Zio AT 4 weeks, orders placed. Registered with [email or web address]  Call to patient, for request of tele visit. Phone number disconnected. I called emergency contact. No answer. LMTCB. I will attempt to get good number to reach patient and update ziosuite site.

## 2018-08-30 NOTE — Telephone Encounter (Signed)
Attempted to reach patient for TCM call and schedule hospital follow up appt. No answer or voicemail. Will try again at a later time.

## 2018-09-01 NOTE — Telephone Encounter (Signed)
Attempted to call the patient at her home # to schedule a telehealth visit and confirm demographics for her zio monitor- message received the # has been disconnected.  Attempted to call her emergency contact- [name] at [phone]. Went straight to voice mail- I left a message for Kristin Juarez to please call back to confirm if she has a good contact # for the patient or not.

## 2018-09-03 ENCOUNTER — Encounter: Payer: Self-pay | Admitting: *Deleted

## 2018-09-03 NOTE — Telephone Encounter (Signed)
Attempted to call the patient again at her home #.  Message still stating this # has been disconnected.  Will reach out to the RN covering the office today to see if she will mail a letter to the patient asking her to contact our office.

## 2018-09-03 NOTE — Telephone Encounter (Signed)
Letter printed and mailed to patient to contact our office.

## 2018-09-06 NOTE — Discharge Summary (Signed)
[facility]   PATIENT NAME: Kristin Juarez    MR#:  [id number]  DATE OF BIRTH:  [birth year]  DATE OF ADMISSION:  08/27/2018 ADMITTING PHYSICIAN: Milagros Loll, MD  DATE OF DISCHARGE: 08/29/2018  3:00 PM  PRIMARY CARE PHYSICIAN: Alba Cory, MD    ADMISSION DIAGNOSIS:  Syncope and collapse [R55] Elevated troponin [R79.89]  DISCHARGE DIAGNOSIS:  Active Problems:   NSTEMI (non-ST elevated myocardial infarction) (HCC)   SECONDARY DIAGNOSIS:   Past Medical History:  Diagnosis Date  . Hyperlipidemia   . Hypertension   . Hypertensive CKD (chronic kidney disease)   . Hypothyroid   . Osteoporosis   . PVD (peripheral vascular disease) [facility])     HOSPITAL COURSE:   * Non-ST elevation MI.  Suspected on admission, but ruled out by cardiology. Patient had syncopal episode with significant elevation of troponin  No prior cardiac history. No chest pain or shortness of breath.Most likely MI or arrhythmia.  On heparin drip and repeat troponin went up but coming down now.  Start aspirin. Consult cardiology.Echocardiogram appreciated. Patient was on rosuvastatin, LDL is more than 100, changed to atorvastatin 80 mg. Advised to follow in cardio clinic.  * Syncope. Differentials include arrhythmia versus MI. See above. Likely due to Hypotension.  * Hypothyroidism. Significantly elevated TSH on prior labs. Will restart patient's home dose of levothyroxine.   TSH 4.5.  * Hypertension. Continue medications.  * DVT prophylaxis. On heparin drip.   DISCHARGE CONDITIONS:   Stable.  CONSULTS OBTAINED:  Treatment Team:  Yvonne Kendall, MD  DRUG ALLERGIES:   Allergies  Allergen Reactions  . Cozaar [Losartan Potassium] Other (See Comments)    Gas   . Lipitor [Atorvastatin] Other (See Comments)    "full of water"  . Zocor [Simvastatin]     DISCHARGE MEDICATIONS:   Allergies as of 08/29/2018      Reactions    Cozaar [losartan Potassium] Other (See Comments)   Gas   Lipitor [atorvastatin] Other (See Comments)   "full of water"   Zocor [simvastatin]       Medication List    STOP taking these medications   rosuvastatin 40 MG tablet Commonly known as:  Crestor     TAKE these medications   amLODipine 10 MG tablet Commonly known as:  NORVASC Take 1 tablet (10 mg total) by mouth daily. What changed:    medication strength  how much to take Notes to patient:  For blood pressure   atorvastatin 80 MG tablet Commonly known as:  LIPITOR Take 1 tablet (80 mg total) by mouth daily at 6 PM. Notes to patient:  For cholesterol   Synthroid 75 MCG tablet Generic drug:  levothyroxine TAKE 1 TABLET BY MOUTH ONCE DAILY BEFOREBREAKAST Notes to patient:  For thyroid   telmisartan-hydrochlorothiazide 80-25 MG tablet Commonly known as:  MICARDIS HCT Take 1 tablet by mouth daily. Notes to patient:  For blood pressure   umeclidinium bromide 62.5 MCG/INH Aepb Commonly known as:  Incruse Ellipta Inhale 1 puff into the lungs daily.        DISCHARGE INSTRUCTIONS:   Follow with PMD in 1-2 week.  If you experience worsening of your admission symptoms, develop shortness of breath, life threatening emergency, suicidal or homicidal thoughts you must seek medical attention immediately by calling 911 or calling your MD immediately  if symptoms less severe.  You Must read complete instructions/literature along with all the possible adverse reactions/side effects for all the Medicines  you take and that have been prescribed to you. Take any new Medicines after you have completely understood and accept all the possible adverse reactions/side effects.   Please note  You were cared for by a hospitalist during your hospital stay. If you have any questions about your discharge medications or the care you received while you were in the hospital after you are discharged, you can call the unit and asked to  speak with the hospitalist on call if the hospitalist that took care of you is not available. Once you are discharged, your primary care physician will handle any further medical issues. Please note that NO REFILLS for any discharge medications will be authorized once you are discharged, as it is imperative that you return to your primary care physician (or establish a relationship with a primary care physician if you do not have one) for your aftercare needs so that they can reassess your need for medications and monitor your lab values.    Today   CHIEF COMPLAINT:   Chief Complaint  Patient presents with  . Loss of Consciousness    HISTORY OF PRESENT ILLNESS:  Kristin Juarez  is a 83 y.o. female with a known history of hypertension, hypothyroidism, CKD stage III presents to the emergency room with episode of syncope while she was at [employer] shopping.  Patient had no prodromal symptoms.  No chest pain or shortness of breath or lightheadedness.  Unknown downtime.  Was found by other shoppers in store employees.  Was brought to the hospital through EMS.  Here patient feels back to normal.  Did not injure herself anywhere.  EKG shows nothing acute.  Troponin found to be 0.33.  No prior cardiac history.  Electrolytes normal.   VITAL SIGNS:  Blood pressure (!) 152/75, pulse 64, temperature 98.2 F (36.8 C), temperature source Oral, resp. rate 16, height 5\' 2"  (1.575 m), weight 81.2 kg, SpO2 93 %.  I/O:  No intake or output data in the 24 hours ending 09/06/18 0854  PHYSICAL EXAMINATION:  GENERAL:  83 y.o.-year-old patient lying in the bed with no acute distress.  EYES: Pupils equal, round, reactive to light and accommodation. No scleral icterus. Extraocular muscles intact.  HEENT: Head atraumatic, normocephalic. Oropharynx and nasopharynx clear.  NECK:  Supple, no jugular venous distention. No thyroid enlargement, no tenderness.  LUNGS: Normal breath sounds bilaterally, no wheezing,  rales,rhonchi or crepitation. No use of accessory muscles of respiration.  CARDIOVASCULAR: S1, S2 normal. No murmurs, rubs, or gallops.  ABDOMEN: Soft, nontender, nondistended. Bowel sounds present. No organomegaly or mass.  EXTREMITIES: No pedal edema, cyanosis, or clubbing.  NEUROLOGIC: Cranial nerves II through XII are intact. Muscle strength 5/5 in all extremities. Sensation intact. Gait not checked.  PSYCHIATRIC: The patient is alert and oriented x 3.  SKIN: No obvious rash, lesion, or ulcer.  DATA REVIEW:   CBC No results for input(s): WBC, HGB, HCT, PLT in the last 168 hours.  Chemistries  No results for input(s): NA, K, CL, CO2, GLUCOSE, BUN, CREATININE, CALCIUM, MG, AST, ALT, ALKPHOS, BILITOT in the last 168 hours.  Invalid input(s): GFRCGP  Cardiac Enzymes No results for input(s): TROPONINI in the last 168 hours.  Microbiology Results  No results found for this or any previous visit.  RADIOLOGY:  No results found.  EKG:   Orders placed or performed during the hospital encounter of 08/27/18  . EKG 12-Lead  . EKG 12-Lead  . ED EKG  . ED EKG  Management plans discussed with the patient, family and they are in agreement.  CODE STATUS:  Code Status History    Date Active Date Inactive Code Status Order ID Comments User Context   08/27/2018 1703 08/29/2018 1828 DNR [id number]  Milagros Loll, MD ED    Questions for Most Recent Historical Code Status (Order [id number])    Question Answer Comment   In the event of cardiac or respiratory ARREST Do not call a "code blue"    In the event of cardiac or respiratory ARREST Do not perform Intubation, CPR, defibrillation or ACLS    In the event of cardiac or respiratory ARREST Use medication by any route, position, wound care, and other measures to relive pain and suffering. May use oxygen, suction and manual treatment of airway obstruction as needed for comfort.         Advance Directive Documentation     Most Recent  Value  Type of Advance Directive  Healthcare Power of Attorney  Pre-existing out of facility DNR order (yellow form or pink MOST form)  -  "MOST" Form in Place?  -      TOTAL TIME TAKING CARE OF THIS PATIENT: 35 minutes.    Altamese Dilling M.D on 09/06/2018 at 8:54 AM  Between 7am to 6pm - Pager - [phone]  After 6pm go to [email or web address] - Social research officer, government  [facility]  Office  [phone]  CC: Primary care physician; Alba Cory, MD   Note: This dictation was prepared with Dragon dictation along with smaller phrase technology. Any transcriptional errors that result from this process are unintentional.

## 2018-09-13 ENCOUNTER — Other Ambulatory Visit: Payer: Self-pay | Admitting: Family Medicine

## 2018-09-13 DIAGNOSIS — E039 Hypothyroidism, unspecified: Secondary | ICD-10-CM

## 2018-09-13 NOTE — Telephone Encounter (Signed)
Not able to leave message. Pt need to schedule appt per Dr Carlynn Purl request

## 2018-09-22 ENCOUNTER — Telehealth: Payer: Self-pay | Admitting: Family Medicine

## 2018-09-22 DIAGNOSIS — I129 Hypertensive chronic kidney disease with stage 1 through stage 4 chronic kidney disease, or unspecified chronic kidney disease: Secondary | ICD-10-CM

## 2018-09-22 DIAGNOSIS — I10 Essential (primary) hypertension: Secondary | ICD-10-CM

## 2018-09-23 NOTE — Telephone Encounter (Signed)
Tried to call pt to schedule an appt but call kept dropping

## 2018-09-23 NOTE — Telephone Encounter (Signed)
Marchelle Folks from [facility] called to inquire if they can offer 30 day supply bc pt cannot afford to pay copays for refills. Made aware that pt needs office visit before she can be prescribed further. Marchelle Folks is requesting call back [phone]

## 2018-09-23 NOTE — Telephone Encounter (Signed)
Hypertension medication request: Telmisartan-HCTZ to [facility].   Last office visit pertaining to hypertension: 07/13/2018   BP Readings from Last 3 Encounters:  08/29/18 (!) 152/75  07/13/18 (!) 190/100  12/15/17 140/60    Lab Results  Component Value Date   CREATININE 0.87 08/28/2018   BUN 22 08/28/2018   NA 143 08/28/2018   K 3.4 (L) 08/28/2018   CL 110 08/28/2018   CO2 25 08/28/2018     No follow-ups on file.

## 2018-09-27 ENCOUNTER — Other Ambulatory Visit: Payer: Self-pay | Admitting: Family Medicine

## 2018-09-27 DIAGNOSIS — I129 Hypertensive chronic kidney disease with stage 1 through stage 4 chronic kidney disease, or unspecified chronic kidney disease: Secondary | ICD-10-CM

## 2018-09-27 DIAGNOSIS — N183 Chronic kidney disease, stage 3 unspecified: Secondary | ICD-10-CM

## 2018-09-27 DIAGNOSIS — I10 Essential (primary) hypertension: Secondary | ICD-10-CM

## 2018-12-13 ENCOUNTER — Other Ambulatory Visit: Payer: Self-pay | Admitting: Family Medicine

## 2018-12-13 DIAGNOSIS — E039 Hypothyroidism, unspecified: Secondary | ICD-10-CM

## 2018-12-14 NOTE — Telephone Encounter (Signed)
Tried to contact pt no answer, pt need to schedule appt

## 2018-12-14 NOTE — Telephone Encounter (Signed)
Tried to contact pt again. The phone only rings no voice mail picks up

## 2018-12-15 ENCOUNTER — Other Ambulatory Visit: Payer: Self-pay | Admitting: Family Medicine

## 2018-12-15 DIAGNOSIS — E039 Hypothyroidism, unspecified: Secondary | ICD-10-CM

## 2018-12-22 ENCOUNTER — Ambulatory Visit (INDEPENDENT_AMBULATORY_CARE_PROVIDER_SITE_OTHER): Payer: 59 | Admitting: Family Medicine

## 2018-12-22 ENCOUNTER — Encounter: Payer: Self-pay | Admitting: Family Medicine

## 2018-12-22 ENCOUNTER — Other Ambulatory Visit: Payer: Self-pay

## 2018-12-22 VITALS — BP 168/92 | HR 73 | Temp 97.3°F | Resp 16 | Ht 62.0 in | Wt 181.5 lb

## 2018-12-22 DIAGNOSIS — I639 Cerebral infarction, unspecified: Secondary | ICD-10-CM

## 2018-12-22 DIAGNOSIS — E559 Vitamin D deficiency, unspecified: Secondary | ICD-10-CM

## 2018-12-22 DIAGNOSIS — E039 Hypothyroidism, unspecified: Secondary | ICD-10-CM | POA: Diagnosis not present

## 2018-12-22 DIAGNOSIS — E213 Hyperparathyroidism, unspecified: Secondary | ICD-10-CM

## 2018-12-22 DIAGNOSIS — D692 Other nonthrombocytopenic purpura: Secondary | ICD-10-CM

## 2018-12-22 DIAGNOSIS — I129 Hypertensive chronic kidney disease with stage 1 through stage 4 chronic kidney disease, or unspecified chronic kidney disease: Secondary | ICD-10-CM | POA: Diagnosis not present

## 2018-12-22 DIAGNOSIS — E538 Deficiency of other specified B group vitamins: Secondary | ICD-10-CM | POA: Diagnosis not present

## 2018-12-22 DIAGNOSIS — I10 Essential (primary) hypertension: Secondary | ICD-10-CM

## 2018-12-22 DIAGNOSIS — N183 Chronic kidney disease, stage 3 (moderate): Secondary | ICD-10-CM

## 2018-12-22 MED ORDER — SYNTHROID 75 MCG PO TABS: ORAL_TABLET | ORAL | 1 refills | Status: DC

## 2018-12-22 MED ORDER — AMLODIPINE BESYLATE 2.5 MG PO TABS: 2.5000 mg | ORAL_TABLET | Freq: Every day | ORAL | 0 refills | Status: DC

## 2018-12-22 MED ORDER — TELMISARTAN-HCTZ 80-25 MG PO TABS: 1.0000 | ORAL_TABLET | Freq: Every day | ORAL | 0 refills | Status: DC

## 2018-12-22 NOTE — Progress Notes (Signed)
Name: Kristin Juarez   MRN: [id number]    DOB: 1935-06-20   Date:12/22/2018       Progress Note  Subjective  Chief Complaint  Chief Complaint  Patient presents with  . Medication Refill    Refill on BP medication  . Hypertension    Denies any symptoms  . Depression    Has been on administrative leave from her job at [employer]  . Hypothyroidism    Insurance want patient to switch to generic would like to talk to the doctor about it.  . PVD  . Hyperlipidemia  . Chronic Kidney Disease    HPI  She works at [employer], she was sent here last July because she was fatigued, she is back today because she is out of medication and her job told her she had to come in. Explained importance of compliance with follow up visits and medications  Uncontrolled HTN and hypertensive kidney disease stage III with secondary hyperparathyroidism: used to see Dr. Thedore MinsSingh however stopped going because of cost. She denies pruritis , she has normal urine output. She denies chest pain or palpitation Last GFR was stable, we will resume medications and recheck labs prior to next visit   Major depression severe episode acute: last phq 9 was 20, she states she likes to work and does not want to retire yet, she states mood is better now, but worries about losing her job  Hypothyroidism: she has a long history of hypothyroidism since thyroidectomy for unknown cause. She has not been taking medication for the past month . She has dry skin, she denies constipation. She will resume medication and return in 6 weeks for labs   PVD: she does not recall seeing vascular surgeon, has edema legs, she denies claudication, she does not want to see vascular surgeon at this time. Unchanged   Hyperlipidemia: discussed importance of starting statin therapy and she agrees. She states she tried crestor and tolerated it well, we will resume medication and recheck labs  Senile Purpura: stable on both arms, reassurance given for  now  Vitamin D and B12 deficiency: she does not like taking pills, but is willing to try B12 injection today   Patient Active Problem List   Diagnosis Date Noted  . NSTEMI (non-ST elevated myocardial infarction) (HCC) 08/27/2018  . Senile purpura (HCC) 07/13/2018  . Hyperparathyroidism, secondary renal (HCC) 12/06/2017  . Vitamin D deficiency 12/06/2017  . Low serum vitamin B12 12/06/2017  . Severe major depression, single episode, without psychotic features (HCC) 12/01/2017  . Advanced care planning/counseling discussion 07/11/2016  . PVD (peripheral vascular disease) (HCC) 12/18/2014  . Hyperlipidemia 12/18/2014  . Osteoporosis 12/18/2014  . Hypertensive CKD (chronic kidney disease) 12/18/2014  . CKD (chronic kidney disease), stage III (HCC) 12/18/2014  . Hypothyroidism 12/18/2014    Past Surgical History:  Procedure Laterality Date  . ABDOMINAL HYSTERECTOMY    . CLEFT PALATE REPAIR      Family History  Problem Relation Age of Onset  . Heart disease Mother   . Heart attack Mother   . Heart disease Father   . Stroke Father   . Alcohol abuse Brother     Social History   Socioeconomic History  . Marital status: Single    Spouse name: Not on file  . Number of children: 0  . Years of education: Not on file  . Highest education level: Bachelor's degree (e.g., BA, AB, BS)  Occupational History  . Not on file  Social Needs  .  Financial resource strain: Not hard at all  . Food insecurity    Worry: Never true    Inability: Never true  . Transportation needs    Medical: No    Non-medical: No  Tobacco Use  . Smoking status: Never Smoker  . Smokeless tobacco: Never Used  Substance and Sexual Activity  . Alcohol use: No    Alcohol/week: 0.0 standard drinks  . Drug use: No  . Sexual activity: Never  Lifestyle  . Physical activity    Days per week: 3 days    Minutes per session: 20 min  . Stress: Not at all  Relationships  . Social Musicianconnections    Talks on  phone: Once a week    Gets together: Never    Attends religious service: Never    Active member of club or organization: No    Attends meetings of clubs or organizations: Never    Relationship status: Never married  . Intimate partner violence    Fear of current or ex partner: No    Emotionally abused: No    Physically abused: No    Forced sexual activity: No  Other Topics Concern  . Not on file  Social History Narrative   Her niece moved in with her in 2018     Current Outpatient Medications:  .  amLODipine (NORVASC) 10 MG tablet, Take 1 tablet (10 mg total) by mouth daily., Disp: 30 tablet, Rfl: 0 .  atorvastatin (LIPITOR) 80 MG tablet, Take 1 tablet (80 mg total) by mouth daily at 6 PM., Disp: 30 tablet, Rfl: 0 .  SYNTHROID 75 MCG tablet, TAKE 1 TABLET BY MOUTH ONCE DAILY BEFOREBREAKAST, Disp: 30 tablet, Rfl: 1 .  telmisartan-hydrochlorothiazide (MICARDIS HCT) 80-25 MG tablet, TAKE 1 TABLET BY MOUTH ONCE DAILY, Disp: 7 tablet, Rfl: 2 .  umeclidinium bromide (INCRUSE ELLIPTA) 62.5 MCG/INH AEPB, Inhale 1 puff into the lungs daily. (Patient not taking: Reported on 12/22/2018), Disp: 60 each, Rfl: 0  Current Facility-Administered Medications:  .  cyanocobalamin ((VITAMIN B-12)) injection 1,000 mcg, 1,000 mcg, Intramuscular, Q30 days, Alba CorySowles, Markavious Micco, MD, 1,000 mcg at 07/13/18 1537  Allergies  Allergen Reactions  . Cozaar [Losartan Potassium] Other (See Comments)    Gas   . Lipitor [Atorvastatin] Other (See Comments)    "full of water"  . Zocor [Simvastatin]     I personally reviewed active problem list, medication list, allergies, family history, social history with the patient/caregiver today.   ROS  Constitutional: Negative for fever or weight change.  Respiratory: Negative for cough and shortness of breath.   Cardiovascular: Negative for chest pain or palpitations.  Gastrointestinal: Negative for abdominal pain, no bowel changes.  Musculoskeletal: Negative for gait  problem or joint swelling.  Skin: Negative for rash. Very thin skin with senile purpura  Neurological: Negative for dizziness or headache.  No other specific complaints in a complete review of systems (except as listed in HPI above).  Objective  Vitals:   12/22/18 1351  BP: (!) 168/92  Pulse: 73  Resp: 16  Temp: (!) 97.3 F (36.3 C)  TempSrc: Temporal  SpO2: 95%  Weight: 181 lb 8 oz (82.3 kg)  Height: 5\' 2"  (1.575 m)    Body mass index is 33.2 kg/m.  Physical Exam  Constitutional: Patient appears well-developed and well-nourished. Obese No distress.  HEENT: head atraumatic, normocephalic, pupils equal and reactive to light,  neck supple Cardiovascular: Normal rate, regular rhythm and normal heart sounds.  No murmur heard. non pitting  BLE edema. Pulmonary/Chest: Effort normal and breath sounds normal. No respiratory distress. Abdominal: Soft.  There is no tenderness. Skin: senile purpura  Psychiatric: Patient has a normal mood and affect. behavior is normal. Judgment and thought content normal.  PHQ2/9: Depression screen [facility] 2/9 12/22/2018 07/13/2018 12/01/2017 07/11/2016 12/18/2014  Decreased Interest 0 0 3 0 1  Down, Depressed, Hopeless 0 0 2 0 0  PHQ - 2 Score 0 0 5 0 1  Altered sleeping 0 1 3 0 -  Tired, decreased energy 0 1 3 1  -  Change in appetite 0 0 2 0 -  Feeling bad or failure about yourself  0 0 1 0 -  Trouble concentrating 0 0 2 0 -  Moving slowly or fidgety/restless 0 0 2 0 -  Suicidal thoughts 0 0 2 0 -  PHQ-9 Score 0 2 20 1  -  Difficult doing work/chores Not difficult at all Not difficult at all Somewhat difficult - -    phq 9 is negative   Fall Risk: Fall Risk  12/22/2018 12/15/2017 07/11/2016 12/18/2014  Falls in the past year? 1 No No No  Comment Syncopy episode at the grocery store - - -  Number falls in past yr: 0 - - -  Injury with Fall? 1 - - -  Risk for fall due to : Other (Comment) - - -     Functional Status Survey: Is the patient deaf or  have difficulty hearing?: Yes(Hearing aid in right ear) Does the patient have difficulty seeing, even when wearing glasses/contacts?: Yes(glasses) Does the patient have difficulty concentrating, remembering, or making decisions?: No Does the patient have difficulty walking or climbing stairs?: No Does the patient have difficulty dressing or bathing?: No Does the patient have difficulty doing errands alone such as visiting a doctor's office or shopping?: No   Assessment & Plan  1. Hypertensive kidney disease with stage 3 chronic kidney disease (HCC)  - amLODipine (NORVASC) 2.5 MG tablet; Take 1 tablet (2.5 mg total) by mouth daily.  Dispense: 90 tablet; Refill: 0 - telmisartan-hydrochlorothiazide (MICARDIS HCT) 80-25 MG tablet; Take 1 tablet by mouth daily.  Dispense: 90 tablet; Refill: 0 - COMPLETE METABOLIC PANEL WITH GFR  2. Hypertension, uncontrolled  - amLODipine (NORVASC) 2.5 MG tablet; Take 1 tablet (2.5 mg total) by mouth daily.  Dispense: 90 tablet; Refill: 0 - telmisartan-hydrochlorothiazide (MICARDIS HCT) 80-25 MG tablet; Take 1 tablet by mouth daily.  Dispense: 90 tablet; Refill: 0 - COMPLETE METABOLIC PANEL WITH GFR  3. Hypothyroidism, unspecified type  - SYNTHROID 75 MCG tablet; TAKE 1 TABLET BY MOUTH ONCE DAILY BEFOREBREAKAST  Dispense: 30 tablet; Refill: 1 - TSH  4. Senile purpura ([place])  Reassurance   5. Low serum vitamin B12  - B12  6. Vitamin D deficiency  - VITAMIN D 25 Hydroxy (Vit-D Deficiency, Fractures)  7. Hyperparathyroidism ([place])  - Parathyroid hormone, intact (no Ca)

## 2018-12-23 ENCOUNTER — Other Ambulatory Visit: Payer: Self-pay | Admitting: Family Medicine

## 2018-12-23 DIAGNOSIS — I129 Hypertensive chronic kidney disease with stage 1 through stage 4 chronic kidney disease, or unspecified chronic kidney disease: Secondary | ICD-10-CM

## 2018-12-23 DIAGNOSIS — E039 Hypothyroidism, unspecified: Secondary | ICD-10-CM

## 2018-12-23 LAB — COMPLETE METABOLIC PANEL WITH GFR
AG Ratio: 1.3 (calc) (ref 1.0–2.5)
ALT: 12 U/L (ref 6–29)
AST: 20 U/L (ref 10–35)
Albumin: 3.9 g/dL (ref 3.6–5.1)
Alkaline phosphatase (APISO): 74 U/L (ref 37–153)
BUN/Creatinine Ratio: 20 (calc) (ref 6–22)
BUN: 28 mg/dL — ABNORMAL HIGH (ref 7–25)
CO2: 26 mmol/L (ref 20–32)
Calcium: 9.5 mg/dL (ref 8.6–10.4)
Chloride: 104 mmol/L (ref 98–110)
Creat: 1.42 mg/dL — ABNORMAL HIGH (ref 0.60–0.88)
GFR, Est African American: 40 mL/min/{1.73_m2} — ABNORMAL LOW (ref >=60)
GFR, Est Non African American: 35 mL/min/{1.73_m2} — ABNORMAL LOW (ref >=60)
Globulin: 2.9 g/dL (calc) (ref 1.9–3.7)
Glucose, Bld: 98 mg/dL (ref 65–99)
Potassium: 4.1 mmol/L (ref 3.5–5.3)
Sodium: 141 mmol/L (ref 135–146)
Total Bilirubin: 0.6 mg/dL (ref 0.2–1.2)
Total Protein: 6.8 g/dL (ref 6.1–8.1)

## 2018-12-23 LAB — TSH: TSH: 21.34 mIU/L — ABNORMAL HIGH (ref 0.40–4.50)

## 2018-12-23 LAB — VITAMIN B12: Vitamin B-12: 390 pg/mL (ref 200–1100)

## 2018-12-23 LAB — PARATHYROID HORMONE, INTACT (NO CA): PTH: 63 pg/mL (ref 14–64)

## 2018-12-23 LAB — VITAMIN D 25 HYDROXY (VIT D DEFICIENCY, FRACTURES): Vit D, 25-Hydroxy: 12 ng/mL — ABNORMAL LOW (ref 30–100)

## 2018-12-23 MED ORDER — LEVOTHYROXINE SODIUM 88 MCG PO TABS: 88.0000 ug | ORAL_TABLET | Freq: Every day | ORAL | 1 refills | Status: DC

## 2019-02-15 ENCOUNTER — Other Ambulatory Visit: Payer: Self-pay

## 2019-02-15 DIAGNOSIS — Z20822 Contact with and (suspected) exposure to covid-19: Secondary | ICD-10-CM

## 2019-02-17 LAB — NOVEL CORONAVIRUS, NAA: SARS-CoV-2, NAA: NOT DETECTED

## 2019-03-25 ENCOUNTER — Ambulatory Visit: Payer: Medicare Other | Admitting: Family Medicine

## 2019-03-30 ENCOUNTER — Ambulatory Visit: Payer: Medicare Other | Admitting: Family Medicine

## 2019-10-26 ENCOUNTER — Emergency Department
Admission: EM | Admit: 2019-10-26 | Discharge: 2019-10-26 | Disposition: A | Discharge status: Home / Self Care | Payer: Medicare Other | Attending: Student | Admitting: Student

## 2019-10-26 ENCOUNTER — Other Ambulatory Visit: Payer: Self-pay

## 2019-10-26 ENCOUNTER — Encounter: Payer: Self-pay | Admitting: Emergency Medicine

## 2019-10-26 DIAGNOSIS — Y929 Unspecified place or not applicable: Secondary | ICD-10-CM | POA: Insufficient documentation

## 2019-10-26 DIAGNOSIS — Y9301 Activity, walking, marching and hiking: Secondary | ICD-10-CM | POA: Insufficient documentation

## 2019-10-26 DIAGNOSIS — W1830XA Fall on same level, unspecified, initial encounter: Secondary | ICD-10-CM | POA: Insufficient documentation

## 2019-10-26 DIAGNOSIS — I129 Hypertensive chronic kidney disease with stage 1 through stage 4 chronic kidney disease, or unspecified chronic kidney disease: Secondary | ICD-10-CM | POA: Insufficient documentation

## 2019-10-26 DIAGNOSIS — S81011A Laceration without foreign body, right knee, initial encounter: Secondary | ICD-10-CM | POA: Diagnosis not present

## 2019-10-26 DIAGNOSIS — N189 Chronic kidney disease, unspecified: Secondary | ICD-10-CM | POA: Insufficient documentation

## 2019-10-26 DIAGNOSIS — Y999 Unspecified external cause status: Secondary | ICD-10-CM | POA: Diagnosis not present

## 2019-10-26 DIAGNOSIS — Z79899 Other long term (current) drug therapy: Secondary | ICD-10-CM | POA: Diagnosis not present

## 2019-10-26 DIAGNOSIS — I252 Old myocardial infarction: Secondary | ICD-10-CM | POA: Diagnosis not present

## 2019-10-26 DIAGNOSIS — E039 Hypothyroidism, unspecified: Secondary | ICD-10-CM | POA: Diagnosis not present

## 2019-10-26 MED ORDER — BACITRACIN-NEOMYCIN-POLYMYXIN 400-5-5000 EX OINT
TOPICAL_OINTMENT | Freq: Once | CUTANEOUS | Status: DC
  Filled 2019-10-26: qty 1

## 2019-10-26 MED ORDER — TRAMADOL HCL 50 MG PO TABS
50.0000 mg | ORAL_TABLET | Freq: Once | ORAL | Status: AC
  Administered 2019-10-26: 50 mg via ORAL
  Filled 2019-10-26: qty 1

## 2019-10-26 MED ORDER — LIDOCAINE-EPINEPHRINE 2 %-1:100000 IJ SOLN
20.0000 mL | Freq: Once | INTRAMUSCULAR | Status: AC
  Administered 2019-10-26: 20 mL

## 2019-10-26 NOTE — ED Provider Notes (Signed)
[facility] Emergency Department Provider Note   ____________________________________________   First MD Initiated Contact with Patient 10/26/19 1140     (approximate)  I have reviewed the triage vital signs and the nursing notes.   HISTORY  Chief Complaint Fall and Laceration    HPI Kristin Juarez is a 84 y.o. female patient presents with a laceration with arterial hemorrhaging.  Pressure dressing applied by EMS prior to arrival.  Patient states this is a mechanical fall.  Patient has LOC or head injury.  Patient denies loss of sensation or function.  Patient rates pain as a 6/10.  Patient described pain is "achy".      Past Medical History:  Diagnosis Date  . Hyperlipidemia   . Hypertension   . Hypertensive CKD (chronic kidney disease)   . Hypothyroid   . Osteoporosis   . PVD (peripheral vascular disease) [facility])     Patient Active Problem List   Diagnosis Date Noted  . NSTEMI (non-ST elevated myocardial infarction) (HCC) 08/27/2018  . Senile purpura (HCC) 07/13/2018  . Hyperparathyroidism, secondary renal (HCC) 12/06/2017  . Vitamin D deficiency 12/06/2017  . Low serum vitamin B12 12/06/2017  . Severe major depression, single episode, without psychotic features (HCC) 12/01/2017  . Advanced care planning/counseling discussion 07/11/2016  . PVD (peripheral vascular disease) (HCC) 12/18/2014  . Hyperlipidemia 12/18/2014  . Osteoporosis 12/18/2014  . Hypertensive CKD (chronic kidney disease) 12/18/2014  . CKD (chronic kidney disease), stage III 12/18/2014  . Hypothyroidism 12/18/2014    Past Surgical History:  Procedure Laterality Date  . ABDOMINAL HYSTERECTOMY    . CLEFT PALATE REPAIR      Prior to Admission medications   Medication Sig Start Date End Date Taking? Authorizing Provider  amLODipine (NORVASC) 2.5 MG tablet Take 1 tablet (2.5 mg total) by mouth daily. 12/22/18   Alba Cory, MD  levothyroxine (SYNTHROID) 88 MCG tablet  Take 1 tablet (88 mcg total) by mouth daily. New dose 12/23/18   Sowles, Danna Hefty, MD  telmisartan-hydrochlorothiazide (MICARDIS HCT) 80-25 MG tablet Take 1 tablet by mouth daily. 12/22/18   Alba Cory, MD    Allergies Cozaar [losartan potassium], Lipitor [atorvastatin], and Zocor [simvastatin]  Family History  Problem Relation Age of Onset  . Heart disease Mother   . Heart attack Mother   . Heart disease Father   . Stroke Father   . Alcohol abuse Brother     Social History Social History   Tobacco Use  . Smoking status: Never Smoker  . Smokeless tobacco: Never Used  Vaping Use  . Vaping Use: Never used  Substance Use Topics  . Alcohol use: No    Alcohol/week: 0.0 standard drinks  . Drug use: No    Review of Systems Constitutional: No fever/chills Eyes: No visual changes. ENT: No sore throat. Cardiovascular: Denies chest pain. Respiratory: Denies shortness of breath. Gastrointestinal: No abdominal pain.  No nausea, no vomiting.  No diarrhea.  No constipation. Genitourinary: Negative for dysuria. Musculoskeletal: Negative for back pain. Skin: Negative for rash. Neurological: Negative for headaches, focal weakness or numbness. Endocrine: Chronic kidney disease, hyperlipidemia, hypertension, hypothyroidism. Allergic/Immunilogical: Cozaar and statins. ____________________________________________   PHYSICAL EXAM:  VITAL SIGNS: ED Triage Vitals  Enc Vitals Group     BP 10/26/19 1117 (!) 180/88     Pulse Rate 10/26/19 1117 73     Resp 10/26/19 1117 18     Temp 10/26/19 1117 98 F (36.7 C)     Temp Source 10/26/19 1117 Oral  SpO2 10/26/19 1117 98 %     Weight 10/26/19 1115 182 lb 15.7 oz (83 kg)     Height 10/26/19 1115 5\' 2"  (1.575 m)     Head Circumference --      Peak Flow --      Pain Score 10/26/19 1115 6     Pain Loc --      Pain Edu? --      Excl. in [place]? --    Constitutional: Alert and oriented. Well appearing and in no acute  distress. Cardiovascular: Normal rate, regular rhythm. Grossly normal heart sounds.  Good peripheral circulation.  Elevated blood pressure. Respiratory: Normal respiratory effort.  No retractions. Lungs CTAB. Gastrointestinal: Soft and nontender. No distention. No abdominal bruits. No CVA tenderness. Musculoskeletal: No lower extremity tenderness nor edema.  No joint effusions. Neurologic:  Normal speech and language. No gross focal neurologic deficits are appreciated. No gait instability. Skin: 4 cm laceration anterior right knee. Psychiatric: Mood and affect are normal. Speech and behavior are normal.  ____________________________________________   LABS (all labs ordered are listed, but only abnormal results are displayed)  Labs Reviewed - No data to display ____________________________________________  EKG   ____________________________________________  RADIOLOGY  ED MD interpretation:    Official radiology report(s): No results found.  ____________________________________________   PROCEDURES  Procedure(s) performed (including Critical Care):  Marland KitchenMarland KitchenLaceration Repair  Date/Time: 10/26/2019 12:42 PM Performed by: Sable Feil, PA-C Authorized by: Sable Feil, PA-C   Consent:    Consent obtained:  Verbal   Consent given by:  Patient   Risks discussed:  Infection, pain, need for additional repair, poor cosmetic result and poor wound healing Anesthesia (see MAR for exact dosages):    Anesthesia method:  Local infiltration   Local anesthetic:  Lidocaine 2% WITH epi Laceration details:    Location:  Leg   Leg location:  R knee   Length (cm):  4   Depth (mm):  3 Repair type:    Repair type:  Simple Pre-procedure details:    Preparation:  Patient was prepped and draped in usual sterile fashion Exploration:    Hemostasis achieved with:  Epinephrine and direct pressure   Wound exploration: wound explored through full range of motion and entire depth of wound  probed and visualized     Contaminated: no   Treatment:    Area cleansed with:  Betadine and saline   Amount of cleaning:  Standard   Irrigation solution:  Sterile saline   Irrigation method:  Syringe   Visualized foreign bodies/material removed: no   Skin repair:    Repair method:  Sutures   Suture size:  3-0   Suture material:  Nylon   Suture technique:  Simple interrupted   Number of sutures:  9 Approximation:    Approximation:  Close Post-procedure details:    Dressing:  Antibiotic ointment, splint for protection and sterile dressing   Patient tolerance of procedure:  Tolerated well, no immediate complications     ____________________________________________   INITIAL IMPRESSION / ASSESSMENT AND PLAN / ED COURSE  As part of my medical decision making, I reviewed the following data within the [facility]     Patient presents with laceration anteriorly secondary to fall.  See procedure note for wound closure.  Patient given discharge care instruction.  Patient placed in a knee immobilizer.  Patient advised return to ED if condition worsens i.e. increase edema, pain, or loss of sensation.  Patient advised to have  sutures removed in 10 days.    Jonah Nestle was evaluated in Emergency Department on 10/26/2019 for the symptoms described in the history of present illness. She was evaluated in the context of the global COVID-19 pandemic, which necessitated consideration that the patient might be at risk for infection with the SARS-CoV-2 virus that causes COVID-19. Institutional protocols and algorithms that pertain to the evaluation of patients at risk for COVID-19 are in a state of rapid change based on information released by regulatory bodies including the CDC and federal and state organizations. These policies and algorithms were followed during the patient's care in the ED.       ____________________________________________   FINAL CLINICAL IMPRESSION(S) /  ED DIAGNOSES  Final diagnoses:  Knee laceration, right, initial encounter     ED Discharge Orders    None       Note:  This document was prepared using Dragon voice recognition software and may include unintentional dictation errors.    Joni Reining, PA-C 10/26/19 1246    Miguel Aschoff., MD 10/26/19 [phone]

## 2019-10-26 NOTE — ED Triage Notes (Signed)
Presents s/p fall  States she fell coming out of bank  Laceration to right lower leg

## 2019-10-26 NOTE — Discharge Instructions (Addendum)
Discharge care instruction avoid nebulizer for 3 to 5 days when walking.  Do not need to sleep in the immobilizer.  Advised extra strength Tylenol as needed for pain.

## 2020-02-07 ENCOUNTER — Telehealth: Payer: Self-pay | Admitting: Family Medicine

## 2020-02-07 NOTE — Telephone Encounter (Signed)
PT not taking her medications, also she's been falling , Nice is concern about her health / please advise

## 2020-02-07 NOTE — Telephone Encounter (Signed)
Attempted to contact pt's niece; left message on voicemail.

## 2020-02-08 NOTE — Telephone Encounter (Signed)
Several attempts have been made to contact patient's niece about her concerns. No answer.

## 2020-02-25 ENCOUNTER — Other Ambulatory Visit: Payer: Self-pay

## 2020-02-25 ENCOUNTER — Emergency Department: Payer: Medicare Other

## 2020-02-25 ENCOUNTER — Inpatient Hospital Stay
Admission: EM | Admit: 2020-02-25 | Discharge: 2020-02-29 | DRG: 392 | Disposition: A | Discharge status: Skilled Nursing Facility | Payer: Medicare Other | Attending: Internal Medicine | Admitting: Internal Medicine

## 2020-02-25 ENCOUNTER — Encounter: Payer: Self-pay | Admitting: Emergency Medicine

## 2020-02-25 DIAGNOSIS — L600 Ingrowing nail: Secondary | ICD-10-CM | POA: Diagnosis present

## 2020-02-25 DIAGNOSIS — A084 Viral intestinal infection, unspecified: Secondary | ICD-10-CM | POA: Diagnosis not present

## 2020-02-25 DIAGNOSIS — Z8249 Family history of ischemic heart disease and other diseases of the circulatory system: Secondary | ICD-10-CM

## 2020-02-25 DIAGNOSIS — Z66 Do not resuscitate: Secondary | ICD-10-CM | POA: Diagnosis present

## 2020-02-25 DIAGNOSIS — I2510 Atherosclerotic heart disease of native coronary artery without angina pectoris: Secondary | ICD-10-CM | POA: Diagnosis present

## 2020-02-25 DIAGNOSIS — N1831 Chronic kidney disease, stage 3a: Secondary | ICD-10-CM

## 2020-02-25 DIAGNOSIS — Z20822 Contact with and (suspected) exposure to covid-19: Secondary | ICD-10-CM | POA: Diagnosis present

## 2020-02-25 DIAGNOSIS — I129 Hypertensive chronic kidney disease with stage 1 through stage 4 chronic kidney disease, or unspecified chronic kidney disease: Secondary | ICD-10-CM | POA: Diagnosis present

## 2020-02-25 DIAGNOSIS — R197 Diarrhea, unspecified: Secondary | ICD-10-CM | POA: Diagnosis present

## 2020-02-25 DIAGNOSIS — B351 Tinea unguium: Secondary | ICD-10-CM

## 2020-02-25 DIAGNOSIS — I252 Old myocardial infarction: Secondary | ICD-10-CM

## 2020-02-25 DIAGNOSIS — N179 Acute kidney failure, unspecified: Secondary | ICD-10-CM | POA: Diagnosis present

## 2020-02-25 DIAGNOSIS — Z79899 Other long term (current) drug therapy: Secondary | ICD-10-CM

## 2020-02-25 DIAGNOSIS — E876 Hypokalemia: Secondary | ICD-10-CM | POA: Diagnosis present

## 2020-02-25 DIAGNOSIS — F32A Depression, unspecified: Secondary | ICD-10-CM | POA: Diagnosis present

## 2020-02-25 DIAGNOSIS — E039 Hypothyroidism, unspecified: Secondary | ICD-10-CM | POA: Diagnosis present

## 2020-02-25 DIAGNOSIS — E785 Hyperlipidemia, unspecified: Secondary | ICD-10-CM | POA: Diagnosis present

## 2020-02-25 DIAGNOSIS — Z87730 Personal history of (corrected) cleft lip and palate: Secondary | ICD-10-CM

## 2020-02-25 DIAGNOSIS — D72829 Elevated white blood cell count, unspecified: Secondary | ICD-10-CM | POA: Diagnosis present

## 2020-02-25 DIAGNOSIS — I739 Peripheral vascular disease, unspecified: Secondary | ICD-10-CM | POA: Diagnosis present

## 2020-02-25 DIAGNOSIS — Z79890 Hormone replacement therapy (postmenopausal): Secondary | ICD-10-CM

## 2020-02-25 DIAGNOSIS — E860 Dehydration: Secondary | ICD-10-CM | POA: Diagnosis present

## 2020-02-25 DIAGNOSIS — Z823 Family history of stroke: Secondary | ICD-10-CM

## 2020-02-25 DIAGNOSIS — Z888 Allergy status to other drugs, medicaments and biological substances status: Secondary | ICD-10-CM

## 2020-02-25 LAB — CBC
HCT: 52.2 % — ABNORMAL HIGH (ref 36.0–46.0)
Hemoglobin: 17.4 g/dL — ABNORMAL HIGH (ref 12.0–15.0)
MCH: 28.5 pg (ref 26.0–34.0)
MCHC: 33.3 g/dL (ref 30.0–36.0)
MCV: 85.6 fL (ref 80.0–100.0)
Platelets: 243 10*3/uL (ref 150–400)
RBC: 6.1 MIL/uL — ABNORMAL HIGH (ref 3.87–5.11)
RDW: 15.7 % — ABNORMAL HIGH (ref 11.5–15.5)
WBC: 11.9 10*3/uL — ABNORMAL HIGH (ref 4.0–10.5)
nRBC: 0 % (ref 0.0–0.2)

## 2020-02-25 LAB — COMPREHENSIVE METABOLIC PANEL
ALT: 15 U/L (ref 0–44)
AST: 28 U/L (ref 15–41)
Albumin: 3.1 g/dL — ABNORMAL LOW (ref 3.5–5.0)
Alkaline Phosphatase: 138 U/L — ABNORMAL HIGH (ref 38–126)
Anion gap: 13 (ref 5–15)
BUN: 25 mg/dL — ABNORMAL HIGH (ref 8–23)
CO2: 30 mmol/L (ref 22–32)
Calcium: 9 mg/dL (ref 8.9–10.3)
Chloride: 93 mmol/L — ABNORMAL LOW (ref 98–111)
Creatinine, Ser: 1.25 mg/dL — ABNORMAL HIGH (ref 0.44–1.00)
GFR, Estimated: 43 mL/min — ABNORMAL LOW (ref >60)
Glucose, Bld: 153 mg/dL — ABNORMAL HIGH (ref 70–99)
Potassium: 3 mmol/L — ABNORMAL LOW (ref 3.5–5.1)
Sodium: 136 mmol/L (ref 135–145)
Total Bilirubin: 1.5 mg/dL — ABNORMAL HIGH (ref 0.3–1.2)
Total Protein: 6.8 g/dL (ref 6.5–8.1)

## 2020-02-25 LAB — URINALYSIS, COMPLETE (UACMP) WITH MICROSCOPIC
Bacteria, UA: NONE SEEN
Glucose, UA: NEGATIVE mg/dL
Hgb urine dipstick: NEGATIVE
Ketones, ur: NEGATIVE mg/dL
Leukocytes,Ua: NEGATIVE
Nitrite: NEGATIVE
Protein, ur: 30 mg/dL — AB
Specific Gravity, Urine: 1.024 (ref 1.005–1.030)
pH: 5 (ref 5.0–8.0)

## 2020-02-25 LAB — GASTROINTESTINAL PANEL BY PCR, STOOL (REPLACES STOOL CULTURE)

## 2020-02-25 LAB — RESPIRATORY PANEL BY RT PCR (FLU A&B, COVID)
Influenza A by PCR: NEGATIVE
Influenza B by PCR: NEGATIVE
SARS Coronavirus 2 by RT PCR: NEGATIVE

## 2020-02-25 LAB — C DIFFICILE QUICK SCREEN W PCR REFLEX
C Diff antigen: NEGATIVE
C Diff interpretation: NOT DETECTED
C Diff toxin: NEGATIVE

## 2020-02-25 LAB — TROPONIN I (HIGH SENSITIVITY): Troponin I (High Sensitivity): 12 ng/L (ref <18)

## 2020-02-25 LAB — TSH: TSH: 13.195 u[IU]/mL — ABNORMAL HIGH (ref 0.350–4.500)

## 2020-02-25 MED ORDER — SODIUM CHLORIDE 0.9 % IV SOLN: INTRAVENOUS | Status: DC

## 2020-02-25 MED ORDER — ACETAMINOPHEN 325 MG PO TABS
650.0000 mg | ORAL_TABLET | Freq: Four times a day (QID) | ORAL | Status: DC | PRN
  Administered 2020-02-26: 650 mg via ORAL
  Filled 2020-02-25: qty 2

## 2020-02-25 MED ORDER — SODIUM CHLORIDE 0.9 % IV SOLN
1000.0000 mL | Freq: Once | INTRAVENOUS | Status: AC
  Administered 2020-02-25: 1000 mL via INTRAVENOUS

## 2020-02-25 MED ORDER — HYDRALAZINE HCL 20 MG/ML IJ SOLN: 5.0000 mg | INTRAMUSCULAR | Status: DC | PRN

## 2020-02-25 MED ORDER — SODIUM CHLORIDE 0.9 % IV BOLUS
500.0000 mL | Freq: Once | INTRAVENOUS | Status: AC
  Administered 2020-02-25: 500 mL via INTRAVENOUS

## 2020-02-25 MED ORDER — MAGNESIUM SULFATE IN D5W 1-5 GM/100ML-% IV SOLN
1.0000 g | Freq: Once | INTRAVENOUS | Status: AC
  Administered 2020-02-25: 1 g via INTRAVENOUS
  Filled 2020-02-25: qty 100

## 2020-02-25 MED ORDER — ONDANSETRON HCL 4 MG/2ML IJ SOLN: 4.0000 mg | Freq: Three times a day (TID) | INTRAMUSCULAR | Status: DC | PRN

## 2020-02-25 MED ORDER — POTASSIUM CHLORIDE CRYS ER 20 MEQ PO TBCR
40.0000 meq | EXTENDED_RELEASE_TABLET | ORAL | Status: AC
  Administered 2020-02-25 (×2): 40 meq via ORAL
  Filled 2020-02-25 (×2): qty 2

## 2020-02-25 NOTE — ED Notes (Signed)
Pt incontinent of bowels. Pt had medium sized loose stool.

## 2020-02-25 NOTE — ED Provider Notes (Addendum)
[facility] Emergency Department Provider Note   ____________________________________________    I have reviewed the triage vital signs and the nursing notes.   HISTORY  Chief Complaint Diarrhea  History limited by likely dementia   HPI Kristin Juarez is a 84 y.o. female with history of hypertension hyperlipidemia, CKD who presents with significant diarrhea over the last 48 hours with weakness.  Niece reports patient has had foul-smelling diarrhea although no recent antibiotics.  Patient is spending most of her time in bed, no reports of fever or foul-smelling urine.  No cough or shortness of breath.  Has not take anything for this.  Niece reports she is having difficulty caring for the patient because of diarrhea so severe.  Past Medical History:  Diagnosis Date  . Hyperlipidemia   . Hypertension   . Hypertensive CKD (chronic kidney disease)   . Hypothyroid   . Osteoporosis   . PVD (peripheral vascular disease) [facility])     Patient Active Problem List   Diagnosis Date Noted  . NSTEMI (non-ST elevated myocardial infarction) (HCC) 08/27/2018  . Senile purpura (HCC) 07/13/2018  . Hyperparathyroidism, secondary renal (HCC) 12/06/2017  . Vitamin D deficiency 12/06/2017  . Low serum vitamin B12 12/06/2017  . Severe major depression, single episode, without psychotic features (HCC) 12/01/2017  . Advanced care planning/counseling discussion 07/11/2016  . PVD (peripheral vascular disease) (HCC) 12/18/2014  . Hyperlipidemia 12/18/2014  . Osteoporosis 12/18/2014  . Hypertensive CKD (chronic kidney disease) 12/18/2014  . CKD (chronic kidney disease), stage III (HCC) 12/18/2014  . Hypothyroidism 12/18/2014    Past Surgical History:  Procedure Laterality Date  . ABDOMINAL HYSTERECTOMY    . CLEFT PALATE REPAIR      Prior to Admission medications   Medication Sig Start Date End Date Taking? Authorizing Provider  amLODipine (NORVASC) 2.5 MG tablet Take  1 tablet (2.5 mg total) by mouth daily. 12/22/18   Alba Cory, MD  levothyroxine (SYNTHROID) 88 MCG tablet Take 1 tablet (88 mcg total) by mouth daily. New dose 12/23/18   Sowles, Danna Hefty, MD  telmisartan-hydrochlorothiazide (MICARDIS HCT) 80-25 MG tablet Take 1 tablet by mouth daily. 12/22/18   Alba Cory, MD     Allergies Cozaar [losartan potassium], Lipitor [atorvastatin], and Zocor [simvastatin]  Family History  Problem Relation Age of Onset  . Heart disease Mother   . Heart attack Mother   . Heart disease Father   . Stroke Father   . Alcohol abuse Brother     Social History Social History   Tobacco Use  . Smoking status: Never Smoker  . Smokeless tobacco: Never Used  Vaping Use  . Vaping Use: Never used  Substance Use Topics  . Alcohol use: No    Alcohol/week: 0.0 standard drinks  . Drug use: No    Review of Systems  Constitutional: No reported fevers Eyes: No discharge ENT: No sore throat. Cardiovascular: No reported chest pain Respiratory: Reported cough Gastrointestinal: No vomiting, diarrhea as above, denies abdominal pain. Genitourinary: No foul-smelling urine Musculoskeletal: Negative for back pain. Skin: Negative for rash. Neurological: Negative for headaches   ____________________________________________   PHYSICAL EXAM:  VITAL SIGNS: ED Triage Vitals  Enc Vitals Group     BP 02/25/20 1044 (!) 148/95     Pulse Rate 02/25/20 1044 86     Resp 02/25/20 1044 18     Temp 02/25/20 1044 97.8 F (36.6 C)     Temp Source 02/25/20 1044 Oral     SpO2 02/25/20  1044 95 %     Weight 02/25/20 1045 59 kg (130 lb)     Height 02/25/20 1045 1.524 m (5')     Head Circumference --      Peak Flow --      Pain Score --      Pain Loc --      Pain Edu? --      Excl. in [place]? --     Constitutional: Alert but disoriented Eyes: Conjunctivae are normal.  Head: Atraumatic. Nose: No congestion/rhinnorhea. Mouth/Throat: Mucous membranes are dry.   Neck:   Painless ROM Cardiovascular: Normal rate, regular rhythm.  Good peripheral circulation. Respiratory: Normal respiratory effort.  No retractions. Lungs CTAB. Gastrointestinal: Soft and nontender. No distention.  Reassuring abdominal exam  Musculoskeletal:   Warm and well perfused Neurologic:  Normal speech and language. No gross focal neurologic deficits are appreciated.  Skin:  Skin is warm, dry and intact. No rash noted. Psychiatric: behavior is normal  ____________________________________________   LABS (all labs ordered are listed, but only abnormal results are displayed)  Labs Reviewed  CBC - Abnormal; Notable for the following components:      Result Value   WBC 11.9 (*)    RBC 6.10 (*)    Hemoglobin 17.4 (*)    HCT 52.2 (*)    RDW 15.7 (*)    All other components within normal limits  COMPREHENSIVE METABOLIC PANEL - Abnormal; Notable for the following components:   Potassium 3.0 (*)    Chloride 93 (*)    Glucose, Bld 153 (*)    BUN 25 (*)    Creatinine, Ser 1.25 (*)    Albumin 3.1 (*)    Alkaline Phosphatase 138 (*)    Total Bilirubin 1.5 (*)    GFR, Estimated 43 (*)    All other components within normal limits  URINALYSIS, COMPLETE (UACMP) WITH MICROSCOPIC - Abnormal; Notable for the following components:   Color, Urine AMBER (*)    APPearance HAZY (*)    Bilirubin Urine SMALL (*)    Protein, ur 30 (*)    All other components within normal limits  C DIFFICILE QUICK SCREEN W PCR REFLEX  RESPIRATORY PANEL BY RT PCR (FLU A&B, COVID)  URINE CULTURE  GASTROINTESTINAL PANEL BY PCR, STOOL (REPLACES STOOL CULTURE)   ____________________________________________  EKG  ED ECG REPORT I, Jene Every, the attending physician, personally viewed and interpreted this ECG.  Date: 02/25/2020  Rhythm: normal sinus rhythm QRS Axis: normal Intervals: normal ST/T Wave abnormalities: Nonspecific T wave changes Narrative Interpretation: no evidence of acute  ischemia  ____________________________________________  RADIOLOGY  Chest x-ray viewed by me, no infiltrate effusion or pneumothorax thorax ____________________________________________   PROCEDURES  Procedure(s) performed: No  Procedures   Critical Care performed: No ____________________________________________   INITIAL IMPRESSION / ASSESSMENT AND PLAN / ED COURSE  Pertinent labs & imaging results that were available during my care of the patient were reviewed by me and considered in my medical decision making (see chart for details).  Patient with a history as noted above presents with generalized weakness, fatigue, far less active than typical in the setting of severe diarrhea.  Niece is having difficulty caring for her.  Differential includes C. difficile, other infectious diarrhea, dehydration, COVID-19.  We will check stool studies, Covid swab, labs.  Patient with mild elevated BUN to creatinine ratio consistent with dehydration.  IV fluids infusing.  Covid negative, C. difficile negative.  We discussed with niece who reiterates difficulty caring  for patient.  Will discuss with hospitalist for admission.    ____________________________________________   FINAL CLINICAL IMPRESSION(S) / ED DIAGNOSES  Final diagnoses:  Diarrhea of presumed infectious origin  Dehydration        Note:  This document was prepared using Dragon voice recognition software and may include unintentional dictation errors.   Jene Every, MD 02/25/20 1339    Jene Every, MD 02/25/20 1400

## 2020-02-25 NOTE — H&P (Signed)
History and Physical    Kristin Juarez TKW:[id number] DOB: 02-17-1936 DOA: 02/25/2020  Referring MD/NP/PA:   PCP: Alba Cory, MD   Patient coming from:  The patient is coming from home.  At baseline, pt is partially dependent for most of ADL.        Chief Complaint: Diarrhea  HPI: Kristin Juarez is a 84 y.o. female with medical history significant of hypertension, hyperlipidemia, PVD, hypothyroidism, CKD stage III, CAD, non-STEMI, possible early stage of dementia, who presents with diarrhea.  Per her niece who is at the bedside, patient has been having diarrhea for more than 2 days.  She has had more than 15 times of watery diarrhea today.  Patient has generalized weakness.  No nausea, vomiting or abdominal pain.  No fever or chills.  No recent antibiotic use.  Patient has mild dry cough, but no chest pain or shortness of breath.  No symptoms of UTI or unilateral weakness.  ED Course: pt was found to have WBC 11.9, negative Covid PCR, negative C. difficile test, negative GI pathogen panel, worsening renal function, potassium 3.0, temperature normal, blood pressure 134/82, heart rate 64, RR 18, oxygen saturation 93% on room air.  Chest x-ray negative.  Patient is placed on MedSurg [place] for observation.  Review of Systems:   General: no fevers, chills, no body weight gain, has poor appetite, has fatigue HEENT: no blurry vision, hearing changes or sore throat Respiratory: no dyspnea, has coughing, no wheezing CV: no chest pain, no palpitations GI: no nausea, vomiting, abdominal pain, has diarrhea, no constipation GU: no dysuria, burning on urination, increased urinary frequency, hematuria  Ext: no leg edema Neuro: no unilateral weakness, numbness, or tingling, no vision change or hearing loss Skin: no rash, no skin tear. MSK: No muscle spasm, no deformity, no limitation of range of movement in spin Heme: No easy bruising.  Travel history: No recent long distant travel.  Allergy:   Allergies  Allergen Reactions  . Cozaar [Losartan Potassium] Other (See Comments)    Gas   . Lipitor [Atorvastatin] Other (See Comments)    "full of water"  . Zocor [Simvastatin]     Past Medical History:  Diagnosis Date  . Hyperlipidemia   . Hypertension   . Hypertensive CKD (chronic kidney disease)   . Hypothyroid   . Osteoporosis   . PVD (peripheral vascular disease) (HCC)     Past Surgical History:  Procedure Laterality Date  . ABDOMINAL HYSTERECTOMY    . CLEFT PALATE REPAIR      Social History:  reports that she has never smoked. She has never used smokeless tobacco. She reports that she does not drink alcohol and does not use drugs.  Family History:  Family History  Problem Relation Age of Onset  . Heart disease Mother   . Heart attack Mother   . Heart disease Father   . Stroke Father   . Alcohol abuse Brother      Prior to Admission medications   Medication Sig Start Date End Date Taking? Authorizing Provider  amLODipine (NORVASC) 2.5 MG tablet Take 1 tablet (2.5 mg total) by mouth daily. 12/22/18   Alba Cory, MD  levothyroxine (SYNTHROID) 88 MCG tablet Take 1 tablet (88 mcg total) by mouth daily. New dose 12/23/18   Sowles, Danna Hefty, MD  telmisartan-hydrochlorothiazide (MICARDIS HCT) 80-25 MG tablet Take 1 tablet by mouth daily. 12/22/18   Alba Cory, MD    Physical Exam: Vitals:   02/25/20 1500 02/25/20 1530 02/25/20 1600 02/25/20  1715  BP: 131/65 133/70 132/67 129/74  Pulse: 71 69 74 70  Resp:      Temp:      TempSrc:      SpO2: 95% 95% 95% 93%  Weight:      Height:       General: Not in acute distress.  Dry mucosal membrane HEENT:       Eyes: PERRL, EOMI, no scleral icterus.       ENT: No discharge from the ears and nose, no pharynx injection, no tonsillar enlargement.        Neck: No JVD, no bruit, no mass felt. Heme: No neck lymph node enlargement. Cardiac: S1/S2, RRR, No murmurs, No gallops or rubs. Respiratory:  No rales,  wheezing, rhonchi or rubs. GI: Soft, nondistended, nontender, no rebound pain, no organomegaly, BS present. GU: No hematuria Ext: No pitting leg edema bilaterally. 1+DP/PT pulse bilaterally. Musculoskeletal: No joint deformities, No joint redness or warmth, no limitation of ROM in spin. Skin: No rashes.  Neuro: Alert, cranial nerves II-XII grossly intact, moves all extremities normally. Psych: Patient is not psychotic, no suicidal or hemocidal ideation.  Labs on Admission: I have personally reviewed following labs and imaging studies  CBC: Recent Labs  Lab 02/25/20 1131  WBC 11.9*  HGB 17.4*  HCT 52.2*  MCV 85.6  PLT 243   Basic Metabolic Panel: Recent Labs  Lab 02/25/20 1131  NA 136  K 3.0*  CL 93*  CO2 30  GLUCOSE 153*  BUN 25*  CREATININE 1.25*  CALCIUM 9.0   GFR: Estimated Creatinine Clearance: 28.4 mL/min (A) (by C-G formula based on SCr of 1.25 mg/dL (H)). Liver Function Tests: Recent Labs  Lab 02/25/20 1131  AST 28  ALT 15  ALKPHOS 138*  BILITOT 1.5*  PROT 6.8  ALBUMIN 3.1*   No results for input(s): LIPASE, AMYLASE in the last 168 hours. No results for input(s): AMMONIA in the last 168 hours. Coagulation Profile: No results for input(s): INR, PROTIME in the last 168 hours. Cardiac Enzymes: No results for input(s): CKTOTAL, CKMB, CKMBINDEX, TROPONINI in the last 168 hours. BNP (last 3 results) No results for input(s): PROBNP in the last 8760 hours. HbA1C: No results for input(s): HGBA1C in the last 72 hours. CBG: No results for input(s): GLUCAP in the last 168 hours. Lipid Profile: No results for input(s): CHOL, HDL, LDLCALC, TRIG, CHOLHDL, LDLDIRECT in the last 72 hours. Thyroid Function Tests: No results for input(s): TSH, T4TOTAL, FREET4, T3FREE, THYROIDAB in the last 72 hours. Anemia Panel: No results for input(s): VITAMINB12, FOLATE, FERRITIN, TIBC, IRON, RETICCTPCT in the last 72 hours. Urine analysis:    Component Value Date/Time    COLORURINE AMBER (A) 02/25/2020 1131   APPEARANCEUR HAZY (A) 02/25/2020 1131   LABSPEC 1.024 02/25/2020 1131   PHURINE 5.0 02/25/2020 1131   GLUCOSEU NEGATIVE 02/25/2020 1131   HGBUR NEGATIVE 02/25/2020 1131   BILIRUBINUR SMALL (A) 02/25/2020 1131   KETONESUR NEGATIVE 02/25/2020 1131   PROTEINUR 30 (A) 02/25/2020 1131   NITRITE NEGATIVE 02/25/2020 1131   LEUKOCYTESUR NEGATIVE 02/25/2020 1131   Sepsis Labs: @LABRCNTIP (procalcitonin:4,lacticidven:4) ) Recent Results (from the past 240 hour(s))  C Difficile Quick Screen w PCR reflex     Status: None   Collection Time: 02/25/20 11:31 AM   Specimen: STOOL  Result Value Ref Range Status   C Diff antigen NEGATIVE NEGATIVE Final   C Diff toxin NEGATIVE NEGATIVE Final   C Diff interpretation No C. difficile detected.  Final  Comment: Performed at [facility], [address] [place], [place] [address]  Gastrointestinal Panel by PCR , Stool     Status: None   Collection Time: 02/25/20 11:31 AM   Specimen: STOOL  Result Value Ref Range Status   Campylobacter species NOT DETECTED NOT DETECTED Final   Plesimonas shigelloides NOT DETECTED NOT DETECTED Final   Salmonella species NOT DETECTED NOT DETECTED Final   Yersinia enterocolitica NOT DETECTED NOT DETECTED Final   Vibrio species NOT DETECTED NOT DETECTED Final   Vibrio cholerae NOT DETECTED NOT DETECTED Final   Enteroaggregative E coli (EAEC) NOT DETECTED NOT DETECTED Final   Enteropathogenic E coli (EPEC) NOT DETECTED NOT DETECTED Final   Enterotoxigenic E coli (ETEC) NOT DETECTED NOT DETECTED Final   Shiga like toxin producing E coli (STEC) NOT DETECTED NOT DETECTED Final   Shigella/Enteroinvasive E coli (EIEC) NOT DETECTED NOT DETECTED Final   Cryptosporidium NOT DETECTED NOT DETECTED Final   Cyclospora cayetanensis NOT DETECTED NOT DETECTED Final   Entamoeba histolytica NOT DETECTED NOT DETECTED Final   Giardia lamblia NOT DETECTED NOT DETECTED Final   Adenovirus  F40/41 NOT DETECTED NOT DETECTED Final   Astrovirus NOT DETECTED NOT DETECTED Final   Norovirus GI/GII NOT DETECTED NOT DETECTED Final   Rotavirus A NOT DETECTED NOT DETECTED Final   Sapovirus (I, II, IV, and V) NOT DETECTED NOT DETECTED Final    Comment: Performed at [facility], [address] [place], [place] [address]  Respiratory Panel by RT PCR (Flu A&B, Covid) - Nasopharyngeal Swab     Status: None   Collection Time: 02/25/20 11:31 AM   Specimen: Nasopharyngeal Swab  Result Value Ref Range Status   SARS Coronavirus 2 by RT PCR NEGATIVE NEGATIVE Final    Comment: (NOTE) SARS-CoV-2 target nucleic acids are NOT DETECTED.  The SARS-CoV-2 RNA is generally detectable in upper respiratoy specimens during the acute phase of infection. The lowest concentration of SARS-CoV-2 viral copies this assay can detect is 131 copies/mL. A negative result does not preclude SARS-Cov-2 infection and should not be used as the sole basis for treatment or other patient management decisions. A negative result may occur with  improper specimen collection/handling, submission of specimen other than nasopharyngeal swab, presence of viral mutation(s) within the areas targeted by this assay, and inadequate number of viral copies (<131 copies/mL). A negative result must be combined with clinical observations, patient history, and epidemiological information. The expected result is Negative.  Fact Sheet for Patients:  https://www.moore.com/  Fact Sheet for Healthcare Providers:  https://www.young.biz/  This test is no t yet approved or cleared by the [place] FDA and  has been authorized for detection and/or diagnosis of SARS-CoV-2 by FDA under an Emergency Use Authorization (EUA). This EUA will remain  in effect (meaning this test can be used) for the duration of the COVID-19 declaration under Section 564(b)(1) of the Act, 21 U.S.C. section  360bbb-3(b)(1), unless the authorization is terminated or revoked sooner.     Influenza A by PCR NEGATIVE NEGATIVE Final   Influenza B by PCR NEGATIVE NEGATIVE Final    Comment: (NOTE) The Xpert Xpress SARS-CoV-2/FLU/RSV assay is intended as an aid in  the diagnosis of influenza from Nasopharyngeal swab specimens and  should not be used as a sole basis for treatment. Nasal washings and  aspirates are unacceptable for Xpert Xpress SARS-CoV-2/FLU/RSV  testing.  Fact Sheet for Patients: https://www.moore.com/  Fact Sheet for Healthcare Providers: https://www.young.biz/  This test is not  yet approved or cleared by the [place] FDA and  has been authorized for detection and/or diagnosis of SARS-CoV-2 by  FDA under an Emergency Use Authorization (EUA). This EUA will remain  in effect (meaning this test can be used) for the duration of the  Covid-19 declaration under Section 564(b)(1) of the Act, 21  U.S.C. section 360bbb-3(b)(1), unless the authorization is  terminated or revoked. Performed at [facility], [address] [place], [place] [address]      Radiological Exams on Admission: [facility]  Result Date: 02/25/2020 CLINICAL DATA:  Weakness. EXAM: PORTABLE CHEST 1 VIEW COMPARISON:  August 27, 2018. FINDINGS: The heart size and mediastinal contours are within normal limits. Both lungs are clear. Stable elevated right hemidiaphragm is noted. No pneumothorax or pleural effusion is noted. The visualized skeletal structures are unremarkable. IMPRESSION: No active disease. Aortic Atherosclerosis (ICD10-I70.0). Electronically Signed   By: Lupita RaiderJames  Green Jr M.D.   On: 02/25/2020 12:34     EKG: I have personally reviewed.  Sinus rhythm, QTC 446, T wave inversion in lead inferior leads and V3-V6  Assessment/Plan Principal Problem:   Diarrhea Active Problems:   Acute renal failure superimposed on stage 3a chronic kidney  disease (HCC)   Hypothyroidism   CAD (coronary artery disease)   Hypokalemia   Diarrhea: Etiology is not clear.  C. difficile PCR negative.  GI pathogen panel negative.  Patient does not have nausea vomiting or abdominal pain.  No fever.  Has mild leukocytosis.  Patient has dry mucous membrane indicating dehydration.  -Placed on MedSurg bed for observation -As needed Zofran -IV fluid: Total 1.5 L normal saline bolus, then 75 cc/h -Consult social work for possible rehab placement -PT/OT  Acute renal failure superimposed on stage 3a chronic kidney disease (HCC): Baseline creatinine is about 1.0, her creatinine is 1.25, BUN 25, most likely due to dehydration. -IV fluid as above -Avoid using renal toxic medications -Hold Micardis  Hypothyroidism: pt is not taking medications currently -check TSH  CAD (coronary artery disease): Patient does not have chest pain, further EKG showed T wave inversion in inferior leads and V3-V6. Pt is not taking any medications currently. -will check trop x 1  Hypokalemia: K= 3.0 on admission. - Repleted - Check Mg level - Give 1 g of magnesium sulfate    DVT ppx: SQ Lovenox Code Status: DNR per her niece Family Communication:   Yes, patient's niece   at bed side Disposition Plan:  Anticipate discharge back to previous environment Consults called: None Admission status: Med-surg bed for obs   Status is: Observation  The patient remains OBS appropriate and will d/c before 2 midnights.  Dispo: The patient is from: Home              Anticipated d/c is to: to be determined              Anticipated d/c date is: 1 day              Patient currently is not medically stable to d/c.            Date of Service 02/25/2020    Lorretta HarpXilin Hoy Fallert [facility]   If 7PM-7AM, please contact night-coverage [email or web address] 02/25/2020, 5:39 PM

## 2020-02-25 NOTE — ED Notes (Signed)
Pt unable to eat her dinner due to her cleft palet and her teeth not being with her. Family will go home and get her teeth. Pt did drink a cup of water and a boost. Niece remains at the bedside.

## 2020-02-25 NOTE — ED Notes (Signed)
Pt has had a second BM. Diarrhea is watery. Pt cleaned and new chux and diaper applied. Visitor is coming back to pt.

## 2020-02-26 DIAGNOSIS — Z20822 Contact with and (suspected) exposure to covid-19: Secondary | ICD-10-CM | POA: Diagnosis present

## 2020-02-26 DIAGNOSIS — D72829 Elevated white blood cell count, unspecified: Secondary | ICD-10-CM | POA: Diagnosis present

## 2020-02-26 DIAGNOSIS — Z87730 Personal history of (corrected) cleft lip and palate: Secondary | ICD-10-CM | POA: Diagnosis not present

## 2020-02-26 DIAGNOSIS — N1831 Chronic kidney disease, stage 3a: Secondary | ICD-10-CM | POA: Diagnosis present

## 2020-02-26 DIAGNOSIS — R197 Diarrhea, unspecified: Secondary | ICD-10-CM | POA: Diagnosis present

## 2020-02-26 DIAGNOSIS — Z888 Allergy status to other drugs, medicaments and biological substances status: Secondary | ICD-10-CM | POA: Diagnosis not present

## 2020-02-26 DIAGNOSIS — E860 Dehydration: Secondary | ICD-10-CM | POA: Diagnosis present

## 2020-02-26 DIAGNOSIS — B351 Tinea unguium: Secondary | ICD-10-CM | POA: Diagnosis present

## 2020-02-26 DIAGNOSIS — Z823 Family history of stroke: Secondary | ICD-10-CM | POA: Diagnosis not present

## 2020-02-26 DIAGNOSIS — Z79890 Hormone replacement therapy (postmenopausal): Secondary | ICD-10-CM | POA: Diagnosis not present

## 2020-02-26 DIAGNOSIS — F32A Depression, unspecified: Secondary | ICD-10-CM | POA: Diagnosis present

## 2020-02-26 DIAGNOSIS — Z8249 Family history of ischemic heart disease and other diseases of the circulatory system: Secondary | ICD-10-CM | POA: Diagnosis not present

## 2020-02-26 DIAGNOSIS — I2510 Atherosclerotic heart disease of native coronary artery without angina pectoris: Secondary | ICD-10-CM

## 2020-02-26 DIAGNOSIS — A084 Viral intestinal infection, unspecified: Secondary | ICD-10-CM | POA: Diagnosis present

## 2020-02-26 DIAGNOSIS — E876 Hypokalemia: Secondary | ICD-10-CM | POA: Diagnosis present

## 2020-02-26 DIAGNOSIS — I739 Peripheral vascular disease, unspecified: Secondary | ICD-10-CM | POA: Diagnosis present

## 2020-02-26 DIAGNOSIS — Z79899 Other long term (current) drug therapy: Secondary | ICD-10-CM | POA: Diagnosis not present

## 2020-02-26 DIAGNOSIS — I252 Old myocardial infarction: Secondary | ICD-10-CM | POA: Diagnosis not present

## 2020-02-26 DIAGNOSIS — L600 Ingrowing nail: Secondary | ICD-10-CM | POA: Diagnosis present

## 2020-02-26 DIAGNOSIS — E785 Hyperlipidemia, unspecified: Secondary | ICD-10-CM | POA: Diagnosis present

## 2020-02-26 DIAGNOSIS — Z66 Do not resuscitate: Secondary | ICD-10-CM | POA: Diagnosis present

## 2020-02-26 DIAGNOSIS — N179 Acute kidney failure, unspecified: Secondary | ICD-10-CM | POA: Diagnosis present

## 2020-02-26 DIAGNOSIS — I129 Hypertensive chronic kidney disease with stage 1 through stage 4 chronic kidney disease, or unspecified chronic kidney disease: Secondary | ICD-10-CM | POA: Diagnosis present

## 2020-02-26 DIAGNOSIS — E039 Hypothyroidism, unspecified: Secondary | ICD-10-CM | POA: Diagnosis present

## 2020-02-26 LAB — CBC
HCT: 40.8 % (ref 36.0–46.0)
Hemoglobin: 13.5 g/dL (ref 12.0–15.0)
MCH: 29.3 pg (ref 26.0–34.0)
MCHC: 33.1 g/dL (ref 30.0–36.0)
MCV: 88.5 fL (ref 80.0–100.0)
Platelets: 197 10*3/uL (ref 150–400)
RBC: 4.61 MIL/uL (ref 3.87–5.11)
RDW: 15.8 % — ABNORMAL HIGH (ref 11.5–15.5)
WBC: 7 10*3/uL (ref 4.0–10.5)
nRBC: 0 % (ref 0.0–0.2)

## 2020-02-26 LAB — MAGNESIUM: Magnesium: 2.3 mg/dL (ref 1.7–2.4)

## 2020-02-26 LAB — BASIC METABOLIC PANEL
Anion gap: 8 (ref 5–15)
BUN: 21 mg/dL (ref 8–23)
CO2: 26 mmol/L (ref 22–32)
Calcium: 7.9 mg/dL — ABNORMAL LOW (ref 8.9–10.3)
Chloride: 104 mmol/L (ref 98–111)
Creatinine, Ser: 0.92 mg/dL (ref 0.44–1.00)
GFR, Estimated: >60 mL/min (ref >60)
Glucose, Bld: 103 mg/dL — ABNORMAL HIGH (ref 70–99)
Potassium: 3.7 mmol/L (ref 3.5–5.1)
Sodium: 138 mmol/L (ref 135–145)

## 2020-02-26 LAB — URINE CULTURE: Culture: NO GROWTH

## 2020-02-26 MED ORDER — HEPARIN SODIUM (PORCINE) 5000 UNIT/ML IJ SOLN
5000.0000 [IU] | Freq: Three times a day (TID) | INTRAMUSCULAR | Status: DC
  Administered 2020-02-26 – 2020-02-29 (×8): 5000 [IU] via SUBCUTANEOUS
  Filled 2020-02-26 (×8): qty 1

## 2020-02-26 NOTE — TOC Initial Note (Signed)
Transition of Care [facility]) - Initial/Assessment Note    Patient Details  Name: Kristin Juarez MRN: [id number] Date of Birth: 07-06-1935  Transition of Care [facility]) CM/SW Contact:    Luvenia Redden, RN Phone Number: 02/26/2020, 12:11 PM  Clinical Narrative:                 PT recommends SNF for ongoing rehab upon discharge. Spoke with POA niece Wilkie Aye Prario) who verify pt's lives with her in a split level home. Reports pt will be able to obtain her medications and has transportation for all medical appointments. Discussed the recommendations for SNF placement as daughter agreed to all agencies that are willing to take pt for rehab. Offered facilities via CMA with grading for choice. Again POA-niece is the only family and agreed to any of the faxed facility for rehab. TOC RN will start this process for placement based upon choices for nearby SNF facilities. Niece states she has applied for MCD and has an assigned case worker for this pt that is pending. Several topics were discussed for care post-op SNF discharge once pt returns home. Niece very grateful for the information provided today.  Process started with PASSR# [id number] A 02/26/2020.  TOC team will continues to work with pt on discharge needs.  Expected Discharge Plan: Skilled Nursing Facility Barriers to Discharge: Continued Medical Work up   Patient Goals and CMS Choice   CMS Medicare.gov Compare Post Acute Care list provided to:: Patient Represenative (must comment) (POA niece-Kristy Prario) Choice offered to / list presented to : [facility] POA / Guardian  Expected Discharge Plan and Services Expected Discharge Plan: Skilled Nursing Facility In-house Referral: Clinical Social Work   Post Acute Care Choice: Skilled Nursing Facility Living arrangements for the past 2 months: Single Family Home                                      Prior Living Arrangements/Services Living arrangements for the past 2 months: Single  Family Home Lives with:: Other (Comment) (Niece-Kristy Prario) Patient language and need for interpreter reviewed:: Yes Do you feel safe going back to the place where you live?: Yes      Need for Family Participation in Patient Care: Yes (Comment) Care giver support system in place?: Yes (comment)   Criminal Activity/Legal Involvement Pertinent to Current Situation/Hospitalization: No - Comment as needed  Activities of Daily Living Home Assistive Devices/Equipment: None ADL Screening (condition at time of admission) Patient's cognitive ability adequate to safely complete daily activities?: Yes Is the patient deaf or have difficulty hearing?: Yes Does the patient have difficulty seeing, even when wearing glasses/contacts?: Yes Does the patient have difficulty concentrating, remembering, or making decisions?: Yes Patient able to express need for assistance with ADLs?: Yes Does the patient have difficulty dressing or bathing?: Yes Independently performs ADLs?: Yes (appropriate for developmental age) Does the patient have difficulty walking or climbing stairs?: Yes Weakness of Legs: Both Weakness of Arms/Hands: Both  Permission Sought/Granted            Permission granted to share info w Relationship: POA-Niece  Permission granted to share info w Contact Information: Carmin Muskrat  Emotional Assessment Appearance:: Appears stated age   Affect (typically observed): Stable     Psych Involvement: No (comment)  Admission diagnosis:  Diarrhea of presumed infectious origin [R19.7] Diarrhea [R19.7] Dehydration [E86.0] Patient Active Problem List   Diagnosis Date Noted  .  Diarrhea 02/25/2020  . CAD (coronary artery disease) 02/25/2020  . Hypokalemia 02/25/2020  . NSTEMI (non-ST elevated myocardial infarction) (HCC) 08/27/2018  . Senile purpura (HCC) 07/13/2018  . Hyperparathyroidism, secondary renal (HCC) 12/06/2017  . Vitamin D deficiency 12/06/2017  . Low serum vitamin B12  12/06/2017  . Severe major depression, single episode, without psychotic features (HCC) 12/01/2017  . Advanced care planning/counseling discussion 07/11/2016  . PVD (peripheral vascular disease) (HCC) 12/18/2014  . Hyperlipidemia 12/18/2014  . Osteoporosis 12/18/2014  . Hypertensive CKD (chronic kidney disease) 12/18/2014  . Acute renal failure superimposed on stage 3a chronic kidney disease (HCC) 12/18/2014  . Hypothyroidism 12/18/2014   PCP:  Alba Cory, MD Pharmacy:   [facility] [place], [place] - [address] [place] [address] Phone: [phone] Fax: [phone]     Social Determinants of Health (SDOH) Interventions    Readmission Risk Interventions No flowsheet data found.

## 2020-02-26 NOTE — Progress Notes (Addendum)
PROGRESS NOTE    Kristin Juarez  WUJ:[id number] DOB: Sep 14, 1935 DOA: 02/25/2020 PCP: Alba CorySowles, Krichna, MD    Brief Narrative:  Kristin Juarez is a 84 y.o. female with medical history significant of hypertension, hyperlipidemia, PVD, hypothyroidism, CKD stage III, CAD, non-STEMI, possible early stage of dementia, who presents with diarrhea. x2 days. pt was found to have WBC 11.9, negative Covid PCR, negative C. difficile test, negative GI pathogen panel, worsening renal function, potassium 3.0, temperature normal, blood pressure 134/82, heart rate 64, RR 18, oxygen saturation 93% on room air.  Chest x-ray negative.Covid PCR, negative C. difficile test, negative GI pathogen panel, worsening renal function, potassium 3.0,    Consultants:     Procedures:   Antimicrobials:       Subjective: Per nsg, pt had mushy stool x1, starting to form . No n/v.   Objective: Vitals:   02/25/20 1814 02/25/20 1921 02/25/20 2326 02/26/20 0417  BP: (!) 144/87 (!) 124/92 (!) 93/56 120/68  Pulse: 71 67 (!) 58 60  Resp: 16 18 20 20   Temp: 98.5 F (36.9 C) 98.3 F (36.8 C) 98.3 F (36.8 C) 97.6 F (36.4 C)  TempSrc: Oral Oral Oral Oral  SpO2: 93% 94% 94% 93%  Weight:      Height:        Intake/Output Summary (Last 24 hours) at 02/26/2020 0845 Last data filed at 02/26/2020 [id number] Gross per 24 hour  Intake 0 ml  Output --  Net 0 ml   Filed Weights   02/25/20 1045  Weight: 59 kg    Examination:  General exam: Appears calm and comfortable  Respiratory system: Clear to auscultation. Respiratory effort normal. Cardiovascular system: S1 & S2 heard, RRR. No JVD, murmurs, rubs, gallops or clicks. No pedal edema. Gastrointestinal system: Abdomen is nondistended, soft and nontender.  Normal bowel sounds heard. Central nervous system: difficult to understand her as she is mumbles, but what she is able to hear, she answers appropriately. Extremities: very long toe nails. No edema Skin: warm,  dry Psychiatry:  Mood & affect appropriate.in current setting     Data Reviewed: I have personally reviewed following labs and imaging studies  CBC: Recent Labs  Lab 02/25/20 1131  WBC 11.9*  HGB 17.4*  HCT 52.2*  MCV 85.6  PLT 243   Basic Metabolic Panel: Recent Labs  Lab 02/25/20 1131 02/26/20 0414  NA 136  --   K 3.0*  --   CL 93*  --   CO2 30  --   GLUCOSE 153*  --   BUN 25*  --   CREATININE 1.25*  --   CALCIUM 9.0  --   MG  --  2.3   GFR: Estimated Creatinine Clearance: 27.9 mL/min (A) (by C-G formula based on SCr of 1.25 mg/dL (H)). Liver Function Tests: Recent Labs  Lab 02/25/20 1131  AST 28  ALT 15  ALKPHOS 138*  BILITOT 1.5*  PROT 6.8  ALBUMIN 3.1*   No results for input(s): LIPASE, AMYLASE in the last 168 hours. No results for input(s): AMMONIA in the last 168 hours. Coagulation Profile: No results for input(s): INR, PROTIME in the last 168 hours. Cardiac Enzymes: No results for input(s): CKTOTAL, CKMB, CKMBINDEX, TROPONINI in the last 168 hours. BNP (last 3 results) No results for input(s): PROBNP in the last 8760 hours. HbA1C: No results for input(s): HGBA1C in the last 72 hours. CBG: No results for input(s): GLUCAP in the last 168 hours. Lipid Profile: No results for input(s):  CHOL, HDL, LDLCALC, TRIG, CHOLHDL, LDLDIRECT in the last 72 hours. Thyroid Function Tests: Recent Labs    02/25/20 1805  TSH 13.195*   Anemia Panel: No results for input(s): VITAMINB12, FOLATE, FERRITIN, TIBC, IRON, RETICCTPCT in the last 72 hours. Sepsis Labs: No results for input(s): PROCALCITON, LATICACIDVEN in the last 168 hours.  Recent Results (from the past 240 hour(s))  C Difficile Quick Screen w PCR reflex     Status: None   Collection Time: 02/25/20 11:31 AM   Specimen: STOOL  Result Value Ref Range Status   C Diff antigen NEGATIVE NEGATIVE Final   C Diff toxin NEGATIVE NEGATIVE Final   C Diff interpretation No C. difficile detected.  Final     Comment: Performed at [facility], [address] [place], [place] [address]  Gastrointestinal Panel by PCR , Stool     Status: None   Collection Time: 02/25/20 11:31 AM   Specimen: STOOL  Result Value Ref Range Status   Campylobacter species NOT DETECTED NOT DETECTED Final   Plesimonas shigelloides NOT DETECTED NOT DETECTED Final   Salmonella species NOT DETECTED NOT DETECTED Final   Yersinia enterocolitica NOT DETECTED NOT DETECTED Final   Vibrio species NOT DETECTED NOT DETECTED Final   Vibrio cholerae NOT DETECTED NOT DETECTED Final   Enteroaggregative E coli (EAEC) NOT DETECTED NOT DETECTED Final   Enteropathogenic E coli (EPEC) NOT DETECTED NOT DETECTED Final   Enterotoxigenic E coli (ETEC) NOT DETECTED NOT DETECTED Final   Shiga like toxin producing E coli (STEC) NOT DETECTED NOT DETECTED Final   Shigella/Enteroinvasive E coli (EIEC) NOT DETECTED NOT DETECTED Final   Cryptosporidium NOT DETECTED NOT DETECTED Final   Cyclospora cayetanensis NOT DETECTED NOT DETECTED Final   Entamoeba histolytica NOT DETECTED NOT DETECTED Final   Giardia lamblia NOT DETECTED NOT DETECTED Final   Adenovirus F40/41 NOT DETECTED NOT DETECTED Final   Astrovirus NOT DETECTED NOT DETECTED Final   Norovirus GI/GII NOT DETECTED NOT DETECTED Final   Rotavirus A NOT DETECTED NOT DETECTED Final   Sapovirus (I, II, IV, and V) NOT DETECTED NOT DETECTED Final    Comment: Performed at [facility], [address] [place], [place] [address]  Respiratory Panel by RT PCR (Flu A&B, Covid) - Nasopharyngeal Swab     Status: None   Collection Time: 02/25/20 11:31 AM   Specimen: Nasopharyngeal Swab  Result Value Ref Range Status   SARS Coronavirus 2 by RT PCR NEGATIVE NEGATIVE Final    Comment: (NOTE) SARS-CoV-2 target nucleic acids are NOT DETECTED.  The SARS-CoV-2 RNA is generally detectable in upper respiratoy specimens during the acute phase of infection. The lowest concentration of  SARS-CoV-2 viral copies this assay can detect is 131 copies/mL. A negative result does not preclude SARS-Cov-2 infection and should not be used as the sole basis for treatment or other patient management decisions. A negative result may occur with  improper specimen collection/handling, submission of specimen other than nasopharyngeal swab, presence of viral mutation(s) within the areas targeted by this assay, and inadequate number of viral copies (<131 copies/mL). A negative result must be combined with clinical observations, patient history, and epidemiological information. The expected result is Negative.  Fact Sheet for Patients:  https://www.moore.com/  Fact Sheet for Healthcare Providers:  https://www.young.biz/  This test is no t yet approved or cleared by the [place] FDA and  has been authorized for detection and/or diagnosis of SARS-CoV-2 by FDA under an Emergency Use Authorization (EUA). This  EUA will remain  in effect (meaning this test can be used) for the duration of the COVID-19 declaration under Section 564(b)(1) of the Act, 21 U.S.C. section 360bbb-3(b)(1), unless the authorization is terminated or revoked sooner.     Influenza A by PCR NEGATIVE NEGATIVE Final   Influenza B by PCR NEGATIVE NEGATIVE Final    Comment: (NOTE) The Xpert Xpress SARS-CoV-2/FLU/RSV assay is intended as an aid in  the diagnosis of influenza from Nasopharyngeal swab specimens and  should not be used as a sole basis for treatment. Nasal washings and  aspirates are unacceptable for Xpert Xpress SARS-CoV-2/FLU/RSV  testing.  Fact Sheet for Patients: https://www.moore.com/  Fact Sheet for Healthcare Providers: https://www.young.biz/  This test is not yet approved or cleared by the [place] FDA and  has been authorized for detection and/or diagnosis of SARS-CoV-2 by  FDA under an Emergency Use  Authorization (EUA). This EUA will remain  in effect (meaning this test can be used) for the duration of the  Covid-19 declaration under Section 564(b)(1) of the Act, 21  U.S.C. section 360bbb-3(b)(1), unless the authorization is  terminated or revoked. Performed at [facility], [address] [place], [place] [address]          Radiology Studies: [facility] Chest [place] 1 View  Result Date: 02/25/2020 CLINICAL DATA:  Weakness. EXAM: PORTABLE CHEST 1 VIEW COMPARISON:  August 27, 2018. FINDINGS: The heart size and mediastinal contours are within normal limits. Both lungs are clear. Stable elevated right hemidiaphragm is noted. No pneumothorax or pleural effusion is noted. The visualized skeletal structures are unremarkable. IMPRESSION: No active disease. Aortic Atherosclerosis (ICD10-I70.0). Electronically Signed   By: Lupita Raider M.D.   On: 02/25/2020 12:34        Scheduled Meds: Continuous Infusions: . sodium chloride 75 mL/hr at 02/25/20 2046    Assessment & Plan:   Principal Problem:   Diarrhea Active Problems:   Acute renal failure superimposed on stage 3a chronic kidney disease (HCC)   Hypothyroidism   CAD (coronary artery disease)   Hypokalemia   Diarrhea: Etiology is not clear.  C. difficile, GI panel all negative Mild leukocytosis although may be due to dehydration hemoconcentrated Continue IV fluids PT-recommend SNF   Acute renal failure superimposed on stage 3a chronic kidney disease (HCC): Baseline creatinine is about 1.0, on admission creatinine is 1.25,. Likely prerenal from dehydration/diarrhea Renal function improved with IV fluids Hold micardis Avoid renal toxic medications   Hypothyroidism: pt is not taking medications currently TSH elevated at 13, less than her previous TSH in 2020 Will likely need further management as outpatient  CAD (coronary artery disease):  EKG in comparison to her 2020 has new T wave inversions  Patient is  asymptomatic  We will check echocardiogram  Troponin x1 is negative    Hypokalemia: K= 3.0 on admission. - Repleted, now stable at 3.7 Mg 2.3   Very long toe nails.- have consulted podiatry for clipping as it would be difficult to use regular nail clips. It may also prevent pt to ambulate comfortably  DVT prophylaxis: start heparin sq Code Status:DNR Family Communication: None at bedside  Status is: Observation  The patient remains OBS appropriate and will d/c before 2 midnights.  Dispo: The patient is from: Home              Anticipated d/c is to: SNF              Anticipated d/c date is: 1 day  Patient currently is medically stable to d/c.Needs safe discharge planning, needs SNF.  TOC working on this            LOS: 0 days   Time spent: 35 min with >50% on coc    Lynn Ito, MD [facility] Pager [phone]  If 7PM-7AM, please contact night-coverage [email or web address] Password [email or web address] 02/26/2020, 8:45 AM

## 2020-02-26 NOTE — Consult Note (Signed)
Reason for Consult: Long painful ingrown toenails. Referring Physician: Carlton Juarez is an 84 y.o. female.  HPI: This is an 84 year old female with recent admission for diarrhea.  Noted to have severely elongated painful toenails with some ingrown toenails causing pressure on the skin.  Past Medical History:  Diagnosis Date  . Hyperlipidemia   . Hypertension   . Hypertensive CKD (chronic kidney disease)   . Hypothyroid   . Osteoporosis   . PVD (peripheral vascular disease) (HCC)     Past Surgical History:  Procedure Laterality Date  . ABDOMINAL HYSTERECTOMY    . CLEFT PALATE REPAIR      Family History  Problem Relation Age of Onset  . Heart disease Mother   . Heart attack Mother   . Heart disease Father   . Stroke Father   . Alcohol abuse Brother     Social History:  reports that she has never smoked. She has never used smokeless tobacco. She reports that she does not drink alcohol and does not use drugs.  Allergies:  Allergies  Allergen Reactions  . Cozaar [Losartan Potassium] Other (See Comments)    Gas   . Lipitor [Atorvastatin] Other (See Comments)    "full of water"  . Zocor [Simvastatin]     Medications:  Scheduled: . heparin  5,000 Units Subcutaneous Q8H    Results for orders placed or performed during the hospital encounter of 02/25/20 (from the past 48 hour(s))  CBC     Status: Abnormal   Collection Time: 02/25/20 11:31 AM  Result Value Ref Range   WBC 11.9 (H) 4.0 - 10.5 K/uL   RBC 6.10 (H) 3.87 - 5.11 MIL/uL   Hemoglobin 17.4 (H) 12.0 - 15.0 g/dL   HCT 75.9 (H) 36 - 46 %   MCV 85.6 80.0 - 100.0 fL   MCH 28.5 26.0 - 34.0 pg   MCHC 33.3 30.0 - 36.0 g/dL   RDW 16.3 (H) 84.6 - 65.9 %   Platelets 243 150 - 400 K/uL   nRBC 0.0 0.0 - 0.2 %    Comment: Performed at [facility], [address] [place], [place] [address]  Comprehensive metabolic panel     Status: Abnormal   Collection Time: 02/25/20 11:31 AM  Result Value Ref  Range   Sodium 136 135 - 145 mmol/L   Potassium 3.0 (L) 3.5 - 5.1 mmol/L   Chloride 93 (L) 98 - 111 mmol/L   CO2 30 22 - 32 mmol/L   Glucose, Bld 153 (H) 70 - 99 mg/dL    Comment: Glucose reference range applies only to samples taken after fasting for at least 8 hours.   BUN 25 (H) 8 - 23 mg/dL   Creatinine, Ser 1.77 (H) 0.44 - 1.00 mg/dL   Calcium 9.0 8.9 - 93.9 mg/dL   Total Protein 6.8 6.5 - 8.1 g/dL   Albumin 3.1 (L) 3.5 - 5.0 g/dL   AST 28 15 - 41 U/L   ALT 15 0 - 44 U/L   Alkaline Phosphatase 138 (H) 38 - 126 U/L   Total Bilirubin 1.5 (H) 0.3 - 1.2 mg/dL   GFR, Estimated 43 (L) >60 mL/min    Comment: (NOTE) Calculated using the CKD-EPI Creatinine Equation (2021)    Anion gap 13 5 - 15    Comment: Performed at [facility], [address] [place], [place] [address]  Urinalysis, Complete w Microscopic Urine, Catheterized     Status: Abnormal   Collection  Time: 02/25/20 11:31 AM  Result Value Ref Range   Color, Urine AMBER (A) YELLOW    Comment: BIOCHEMICALS MAY BE AFFECTED BY COLOR   APPearance HAZY (A) CLEAR   Specific Gravity, Urine 1.024 1.005 - 1.030   pH 5.0 5.0 - 8.0   Glucose, UA NEGATIVE NEGATIVE mg/dL   Hgb urine dipstick NEGATIVE NEGATIVE   Bilirubin Urine SMALL (A) NEGATIVE   Ketones, ur NEGATIVE NEGATIVE mg/dL   Protein, ur 30 (A) NEGATIVE mg/dL   Nitrite NEGATIVE NEGATIVE   Leukocytes,Ua NEGATIVE NEGATIVE   RBC / HPF 0-5 0 - 5 RBC/hpf   WBC, UA 6-10 0 - 5 WBC/hpf   Bacteria, UA NONE SEEN NONE SEEN   Squamous Epithelial / LPF 0-5 0 - 5   Mucus PRESENT    Hyaline Casts, UA PRESENT     Comment: Performed at [facility], [address], [place], [place] [address]  Urine culture     Status: None   Collection Time: 02/25/20 11:31 AM   Specimen: Urine, Random  Result Value Ref Range   Specimen Description      URINE, RANDOM Performed at [facility], [address] [place], [place] [address]    Special Requests       NONE Performed at [facility], [address], [place], [place] [address]    Culture      NO GROWTH Performed at [facility], [address], [place], [place] [address]    Report Status 02/26/2020 FINAL   C Difficile Quick Screen w PCR reflex     Status: None   Collection Time: 02/25/20 11:31 AM   Specimen: STOOL  Result Value Ref Range   C Diff antigen NEGATIVE NEGATIVE   C Diff toxin NEGATIVE NEGATIVE   C Diff interpretation No C. difficile detected.     Comment: Performed at [facility], [address] [place], [place] [address]  Gastrointestinal Panel by PCR , Stool     Status: None   Collection Time: 02/25/20 11:31 AM   Specimen: STOOL  Result Value Ref Range   Campylobacter species NOT DETECTED NOT DETECTED   Plesimonas shigelloides NOT DETECTED NOT DETECTED   Salmonella species NOT DETECTED NOT DETECTED   Yersinia enterocolitica NOT DETECTED NOT DETECTED   Vibrio species NOT DETECTED NOT DETECTED   Vibrio cholerae NOT DETECTED NOT DETECTED   Enteroaggregative E coli (EAEC) NOT DETECTED NOT DETECTED   Enteropathogenic E coli (EPEC) NOT DETECTED NOT DETECTED   Enterotoxigenic E coli (ETEC) NOT DETECTED NOT DETECTED   Shiga like toxin producing E coli (STEC) NOT DETECTED NOT DETECTED   Shigella/Enteroinvasive E coli (EIEC) NOT DETECTED NOT DETECTED   Cryptosporidium NOT DETECTED NOT DETECTED   Cyclospora cayetanensis NOT DETECTED NOT DETECTED   Entamoeba histolytica NOT DETECTED NOT DETECTED   Giardia lamblia NOT DETECTED NOT DETECTED   Adenovirus F40/41 NOT DETECTED NOT DETECTED   Astrovirus NOT DETECTED NOT DETECTED   Norovirus GI/GII NOT DETECTED NOT DETECTED   Rotavirus A NOT DETECTED NOT DETECTED   Sapovirus (I, II, IV, and V) NOT DETECTED NOT DETECTED    Comment: Performed at [facility], [address] [place], [place] [address]  Respiratory Panel by RT PCR (Flu A&B, Covid) - Nasopharyngeal Swab     Status: None    Collection Time: 02/25/20 11:31 AM   Specimen: Nasopharyngeal Swab  Result Value Ref Range   SARS Coronavirus 2 by RT PCR NEGATIVE NEGATIVE  Comment: (NOTE) SARS-CoV-2 target nucleic acids are NOT DETECTED.  The SARS-CoV-2 RNA is generally detectable in upper respiratoy specimens during the acute phase of infection. The lowest concentration of SARS-CoV-2 viral copies this assay can detect is 131 copies/mL. A negative result does not preclude SARS-Cov-2 infection and should not be used as the sole basis for treatment or other patient management decisions. A negative result may occur with  improper specimen collection/handling, submission of specimen other than nasopharyngeal swab, presence of viral mutation(s) within the areas targeted by this assay, and inadequate number of viral copies (<131 copies/mL). A negative result must be combined with clinical observations, patient history, and epidemiological information. The expected result is Negative.  Fact Sheet for Patients:  https://www.moore.com/  Fact Sheet for Healthcare Providers:  https://www.young.biz/  This test is no t yet approved or cleared by the [place] FDA and  has been authorized for detection and/or diagnosis of SARS-CoV-2 by FDA under an Emergency Use Authorization (EUA). This EUA will remain  in effect (meaning this test can be used) for the duration of the COVID-19 declaration under Section 564(b)(1) of the Act, 21 U.S.C. section 360bbb-3(b)(1), unless the authorization is terminated or revoked sooner.     Influenza A by PCR NEGATIVE NEGATIVE   Influenza B by PCR NEGATIVE NEGATIVE    Comment: (NOTE) The Xpert Xpress SARS-CoV-2/FLU/RSV assay is intended as an aid in  the diagnosis of influenza from Nasopharyngeal swab specimens and  should not be used as a sole basis for treatment. Nasal washings and  aspirates are unacceptable for Xpert Xpress SARS-CoV-2/FLU/RSV   testing.  Fact Sheet for Patients: https://www.moore.com/  Fact Sheet for Healthcare Providers: https://www.young.biz/  This test is not yet approved or cleared by the [place] FDA and  has been authorized for detection and/or diagnosis of SARS-CoV-2 by  FDA under an Emergency Use Authorization (EUA). This EUA will remain  in effect (meaning this test can be used) for the duration of the  Covid-19 declaration under Section 564(b)(1) of the Act, 21  U.S.C. section 360bbb-3(b)(1), unless the authorization is  terminated or revoked. Performed at [facility], [address] [place], [place] [address]   Troponin I (High Sensitivity)     Status: None   Collection Time: 02/25/20  6:05 PM  Result Value Ref Range   Troponin I (High Sensitivity) 12 <18 ng/L    Comment: (NOTE) Elevated high sensitivity troponin I (hsTnI) values and significant  changes across serial measurements may suggest ACS but many other  chronic and acute conditions are known to elevate hsTnI results.  Refer to the "Links" section for chest pain algorithms and additional  guidance. Performed at [facility], [address] [place], [place] [address]   TSH     Status: Abnormal   Collection Time: 02/25/20  6:05 PM  Result Value Ref Range   TSH 13.195 (H) 0.350 - 4.500 uIU/mL    Comment: Performed by a 3rd Generation assay with a functional sensitivity of <=0.01 uIU/mL. Performed at [facility], [address] [place], [place] [address]   Magnesium     Status: None   Collection Time: 02/26/20  4:14 AM  Result Value Ref Range   Magnesium 2.3 1.7 - 2.4 mg/dL    Comment: Performed at [facility], [address] [place], [place] [address]  Basic metabolic panel     Status: Abnormal   Collection Time: 02/26/20  9:16 AM  Result Value Ref Range  Sodium 138 135 - 145 mmol/L   Potassium 3.7 3.5 - 5.1 mmol/L   Chloride 104 98 -  111 mmol/L   CO2 26 22 - 32 mmol/L   Glucose, Bld 103 (H) 70 - 99 mg/dL    Comment: Glucose reference range applies only to samples taken after fasting for at least 8 hours.   BUN 21 8 - 23 mg/dL   Creatinine, Ser 6.960.92 0.44 - 1.00 mg/dL   Calcium 7.9 (L) 8.9 - 10.3 mg/dL   GFR, Estimated >29>60 >52>60 mL/min    Comment: (NOTE) Calculated using the CKD-EPI Creatinine Equation (2021)    Anion gap 8 5 - 15    Comment: Performed at [facility], [address] [place], [place] [address]  CBC     Status: Abnormal   Collection Time: 02/26/20  9:16 AM  Result Value Ref Range   WBC 7.0 4.0 - 10.5 K/uL   RBC 4.61 3.87 - 5.11 MIL/uL   Hemoglobin 13.5 12.0 - 15.0 g/dL   HCT 44.040.8 36 - 46 %   MCV 88.5 80.0 - 100.0 fL   MCH 29.3 26.0 - 34.0 pg   MCHC 33.1 30.0 - 36.0 g/dL   RDW 10.215.8 (H) 72.511.5 - 36.615.5 %   Platelets 197 150 - 400 K/uL   nRBC 0.0 0.0 - 0.2 %    Comment: Performed at [facility], [address] [place], [place] [address]    DG Chest Port 1 View  Result Date: 02/25/2020 CLINICAL DATA:  Weakness. EXAM: PORTABLE CHEST 1 VIEW COMPARISON:  August 27, 2018. FINDINGS: The heart size and mediastinal contours are within normal limits. Both lungs are clear. Stable elevated right hemidiaphragm is noted. No pneumothorax or pleural effusion is noted. The visualized skeletal structures are unremarkable. IMPRESSION: No active disease. Aortic Atherosclerosis (ICD10-I70.0). Electronically Signed   By: Lupita RaiderJames  Green Jr M.D.   On: 02/25/2020 12:34    Review of Systems  Constitutional: Negative for chills and fever.  HENT: Negative for congestion and sore throat.   Respiratory: Negative for cough and shortness of breath.   Cardiovascular: Negative for chest pain and palpitations.  Gastrointestinal: Positive for diarrhea.  Endocrine: Negative for polydipsia and polyuria.  Genitourinary: Negative for frequency and urgency.  Musculoskeletal: Negative for joint swelling and myalgias.   Skin:       Relates thick painful and ingrown toenails.  Neurological: Negative for numbness.  Psychiatric/Behavioral: Negative for behavioral problems. The patient is not nervous/anxious.    Blood pressure 138/65, pulse 62, temperature 97.9 F (36.6 C), temperature source Oral, resp. rate 16, height 5' (1.524 m), weight 59 kg, SpO2 95 %. Physical Exam Cardiovascular:     Comments: Pulses are diminished but palpable 1/4. Musculoskeletal:     Comments: Adequate range of motion of the pedal joints.  Muscle testing deferred.  Skin:    Comments: The skin is warm dry and somewhat atrophic with some bilateral edema.  All 10 toenails are severely elongated, thick, dystrophic, discolored, brittle with subungual debris.  Multiple nails are ingrown including the left hallux with some mild erythema medially.  No active drainage.  Neurological:     Comments: Sensation appears grossly intact.     Assessment/Plan: 1.  Paronychia left hallux. 2.  Painful onychomycosis with onychocryptosis.  Plan: Debrided all 10 painful toenails with care taken remove the ingrown portions of nails.  Follow-up outpatient as needed.  Ricci Barkerodd W Jamerion Cabello 02/26/2020, 5:50 PM

## 2020-02-26 NOTE — Evaluation (Signed)
Occupational Therapy Evaluation Patient Details Name: Kristin Juarez MRN: [id number] DOB: Mar 11, 1936 Today's Date: 02/26/2020    History of Present Illness Patient is an 84 year old female who presents to ED for diarrhea with PMH of HTN, HLD, PVD, hypothyroidism, CKD stage III, CAD, non -STEMI, possible early stage dementia, depression. Per niece patient has had diarrhea for more then 2 days and has had a progressive decline in function and cognition over the past 2 months   Clinical Impression   Kristin Juarez was seen for OT evaluation this date. ~65months PTA pt was Independent in ADLs, now requiring increased assist from niece for bathing/dressing. Pt lives in split level home c niece who works full time. Pt presents to acute OT demonstrating impaired ADL performance and functional mobility 2/2 decreased safety awareness, functional strength/ROM/balance deficits, and pain. Pt currently requires SETUP + VCs for face washing and self-drinking at bed level. MAX A don B socks at bed level. Attempted to initiate bed mobility and pt reports back pain, not quantified, and defers. Pt would benefit from skilled OT to address noted impairments and functional limitations (see below for any additional details) in order to maximize safety and independence while minimizing falls risk and caregiver burden. Upon hospital discharge, recommend STR to maximize pt safety and return to PLOF.     Follow Up Recommendations  SNF    Equipment Recommendations  Other (comment) (TBD)    Recommendations for Other Services       Precautions / Restrictions Precautions Precautions: Fall Restrictions Weight Bearing Restrictions: No      Mobility Bed Mobility Overal bed mobility: Needs Assistance   General bed mobility comments: Pt initiated then refused bed mobility reporting back pain - unable to quantify     Transfers    General transfer comment: Pt refuses attempts        ADL either performed or  assessed with clinical judgement   ADL Overall ADL's : Needs assistance/impaired    General ADL Comments: SETUP + VCs for face washing and self-drinking at bed level. MAX A don B socks at bed level                   Pertinent Vitals/Pain Pain Assessment: Faces Faces Pain Scale: Hurts a little bit Pain Location: back Pain Descriptors / Indicators: Grimacing Pain Intervention(s): Limited activity within patient's tolerance     Hand Dominance Right   Extremity/Trunk Assessment Upper Extremity Assessment Upper Extremity Assessment: Generalized weakness   Lower Extremity Assessment Lower Extremity Assessment: Generalized weakness       Communication Communication Communication: Expressive difficulties   Cognition Arousal/Alertness: Awake/alert Behavior During Therapy: WFL for tasks assessed/performed Overall Cognitive Status: No family/caregiver present to determine baseline cognitive functioning        General Comments: Follows 1 step commands c repetitions and increased time   General Comments  Patient has slight scrape on L forehead, appears well nourished and groomed.    Exercises Exercises: Other exercises Other Exercises Other Exercises: Face washing, self-drinking, adjust torso    Shoulder Instructions      Home Living Family/patient expects to be discharged to:: Skilled nursing facility Living Arrangements: Other relatives          Additional Comments: Patient currently lives with niece in split level home, however due to increased functional and cognitive decline niece knows her aunt needs more help since she is gone all day at work and cannot afford to stay home, has attempted to get social services  to help without luck.      Prior Functioning/Environment Level of Independence: Needs assistance  Gait / Transfers Assistance Needed: needs assistance from niece, two months ago was independent but has declined ADL's / Homemaking Assistance Needed:  needs assitance from niece for bathing, dressing, toileting now, was ind two months ago Communication / Swallowing Assistance Needed: garbled speech         OT Problem List: Decreased strength;Decreased activity tolerance;Decreased range of motion;Impaired balance (sitting and/or standing);Decreased cognition;Decreased safety awareness;Pain      OT Treatment/Interventions: Self-care/ADL training;Therapeutic exercise;Energy conservation;DME and/or AE instruction;Therapeutic activities;Patient/family education;Balance training    OT Goals(Current goals can be found in the care plan section) Acute Rehab OT Goals Patient Stated Goal: to go to SNF OT Goal Formulation: With patient Time For Goal Achievement: 03/11/20 Potential to Achieve Goals: Good ADL Goals Pt Will Perform Grooming: with set-up;sitting;with min guard assist Pt Will Perform Lower Body Dressing: with mod assist;sitting/lateral leans Pt Will Transfer to Toilet: with mod assist;stand pivot transfer;bedside commode (c LRAD PRN)  OT Frequency: Min 1X/week   Barriers to D/C: Decreased caregiver support             AM-PAC OT "6 Clicks" Daily Activity     Outcome Measure Help from another person eating meals?: None Help from another person taking care of personal grooming?: A Little Help from another person toileting, which includes using toliet, bedpan, or urinal?: A Lot Help from another person bathing (including washing, rinsing, drying)?: A Lot Help from another person to put on and taking off regular upper body clothing?: A Little Help from another person to put on and taking off regular lower body clothing?: A Lot 6 Click Score: 16   End of Session    Activity Tolerance: Patient limited by pain Patient left: in bed;with call bell/phone within reach;with bed alarm set  OT Visit Diagnosis: Other abnormalities of gait and mobility (R26.89)                Time: 1355-1403 OT Time Calculation (min): 8 min Charges:   OT General Charges $OT Visit: 1 Visit OT Evaluation $OT Eval Low Complexity: 1 Low  Kristin Juarez, M.S. OTR/L  02/26/20, 2:19 PM  ascom [phone]

## 2020-02-26 NOTE — Plan of Care (Signed)
Continuing with plan of care. 

## 2020-02-26 NOTE — Evaluation (Signed)
Physical Therapy Evaluation Patient Details Name: Kristin Juarez MRN: [id number] DOB: 1935/10/19 Today's Date: 02/26/2020   History of Present Illness  Patient is an 84 year old female who presents to ED for diarrhea with PMH of HTN, HLD, PVD, hypothyroidism, CKD stage III, CAD, non -STEMI, possible early stage dementia, depression. Per niece patient has had diarrhea for more then 2 days and has had a progressive decline in function and cognition over the past 2 months    Clinical Impression  Patient is a very pleasantly confused 84 year old female who presents with diffuse weakness and limited mobility. Patient is poor historian due to confusion and requires frequent re-orientation and simple commands. History obtained from niece who is POA. Patient was ind approximately 38month ago but has had a rapid decline in cognition and mobility. Niece has attempted to contact social work for assistance without success, is asking for help. Patient is able to transition to EOB with Mod A and max cueing. Upon reaching EOB she requires occasional stabilization due to posterior LOB when moving UE's, LE's. Requires Max A to assist in face washing due to lack of control in seated position. Sit to stand requires Max A and max cueing for sequencing. Patient returned to bed with needs met and niece in room to celebrate her birthday. Niece is agreeable and thankful for plan of care. Patient will benefit from skilled physical therapy while in the hospital to increase her strength, mobility, and decrease fall risk. Upon discharge patient will require placement in SNF due to high need for skilled therapy, limited mobility at this time, and high fall risk.     Follow Up Recommendations SNF    Equipment Recommendations  None recommended by PT (none if go to SNF)    Recommendations for Other Services OT consult     Precautions / Restrictions Precautions Precautions: Fall Restrictions Weight Bearing Restrictions: No       Mobility  Bed Mobility Overal bed mobility: Needs Assistance Bed Mobility: Supine to Sit;Sit to Supine     Supine to sit: Mod assist;HOB elevated Sit to supine: Min assist   General bed mobility comments: Patient requires Mod A to get EOB and Min a to return to supine position, requires heavy cueing for sequencing and task orientation.    Transfers Overall transfer level: Modified independent Equipment used: Rolling walker (2 wheeled)             General transfer comment: STS requires max cueing for sequencing, task orientation, hand placement. Max A for transfer.  Ambulation/Gait             General Gait Details: deferred due to safety  Stairs            Wheelchair Mobility    Modified Rankin (Stroke Patients Only)       Balance Overall balance assessment: Needs assistance Sitting-balance support: Bilateral upper extremity supported;Feet supported Sitting balance-Leahy Scale: Fair Sitting balance - Comments: able to sit with UE support, posterior LOB upon moving LE's Postural control: Posterior lean Standing balance support: Bilateral upper extremity supported Standing balance-Leahy Scale: Poor Standing balance comment: Pt requires assistance to stand with RW, unable to stabilize self.                             Pertinent Vitals/Pain Pain Assessment: No/denies pain    Home Living Family/patient expects to be discharged to:: Skilled nursing facility Living Arrangements: Other relatives  Additional Comments: Patient currently lives with niece, however due to increased functional and cognitive decline niece knows her aunt needs more help since she is gone all day at work and cannot afford to stay home, has attempted to get social services to help without luck.    Prior Function Level of Independence: Needs assistance   Gait / Transfers Assistance Needed: needs assistance from niece, two months ago was  independent but has declined  ADL's / 57 Assistance Needed: needs assitance from niece for bathing, dressing, toileting now, was ind two months ago  Comments: Patient has had decline in past two months, niece is asking for help     Hand Dominance        Extremity/Trunk Assessment   Upper Extremity Assessment Upper Extremity Assessment: Defer to OT evaluation    Lower Extremity Assessment Lower Extremity Assessment: Difficult to assess due to impaired cognition;Generalized weakness (grossly 3+/5 bilaterally however limited ability to test at this time)       Communication      Cognition Arousal/Alertness: Awake/alert Behavior During Therapy: WFL for tasks assessed/performed Overall Cognitive Status: History of cognitive impairments - at baseline                                 General Comments: Patient able to follow simple repeated commands, is not oriented to place or situation, is able to recognize niece.      General Comments General comments (skin integrity, edema, etc.): Patient has slight scrape on L forehead, appears well nourished and groomed.    Exercises Other Exercises Other Exercises: Patient is educated on role of PT in acute care role, safe mobility and transfers. Sitting EOB mobility and stability with ADL of washing face.   Assessment/Plan    PT Assessment Patient needs continued PT services  PT Problem List Decreased strength;Decreased activity tolerance;Decreased balance;Decreased cognition;Decreased mobility;Decreased safety awareness       PT Treatment Interventions DME instruction;Gait training;Stair training;Functional mobility training;Neuromuscular re-education;Balance training;Therapeutic exercise;Therapeutic activities;Patient/family education;Wheelchair mobility training    PT Goals (Current goals can be found in the Care Plan section)  Acute Rehab PT Goals Patient Stated Goal: to go to SNF PT Goal Formulation: With  patient/family Time For Goal Achievement: 03/11/20 Potential to Achieve Goals: Fair    Frequency Min 2X/week   Barriers to discharge Decreased caregiver support;Inaccessible home environment Patient will require SNF placement    Co-evaluation               AM-PAC PT "6 Clicks" Mobility  Outcome Measure Help needed turning from your back to your side while in a flat bed without using bedrails?: A Little Help needed moving from lying on your back to sitting on the side of a flat bed without using bedrails?: A Lot Help needed moving to and from a bed to a chair (including a wheelchair)?: A Lot Help needed standing up from a chair using your arms (e.g., wheelchair or bedside chair)?: A Lot Help needed to walk in hospital room?: A Lot Help needed climbing 3-5 steps with a railing? : Total 6 Click Score: 12    End of Session Equipment Utilized During Treatment: Gait belt Activity Tolerance: Patient limited by fatigue Patient left: in bed;with call bell/phone within reach;with bed alarm set;with family/visitor present Nurse Communication: Mobility status PT Visit Diagnosis: Unsteadiness on feet (R26.81);Other abnormalities of gait and mobility (R26.89);Muscle weakness (generalized) (M62.81);Difficulty in walking, not elsewhere classified (  R26.2)    Time: 7011-0034 PT Time Calculation (min) (ACUTE ONLY): 24 min   Charges:   PT Evaluation $PT Eval Moderate Complexity: 1 Mod PT Treatments $Therapeutic Activity: 8-22 mins      Janna Arch, PT, DPT    02/26/2020, 11:43 AM

## 2020-02-26 NOTE — NC FL2 (Signed)
  [place] MEDICAID FL2 LEVEL OF CARE SCREENING TOOL     IDENTIFICATION  Patient Name: Kristin Juarez Birthdate: 04/05/1936 Sex: female Admission Date (Current Location): 02/25/2020  [place] and [place] Number:  Chiropodist and Address:  [facility], [address] [place], [place] [address]      Provider Number: [id number]  Attending Physician Name and Address:  Lynn Ito, MD  Relative Name and Phone Number:  [name]    Current Level of Care: SNF Recommended Level of Care: Skilled Nursing Facility Prior Approval Number:    Date Approved/Denied: 02/26/20 PASRR Number: [id number]  Discharge Plan: SNF    Current Diagnoses: Patient Active Problem List   Diagnosis Date Noted  . Diarrhea 02/25/2020  . CAD (coronary artery disease) 02/25/2020  . Hypokalemia 02/25/2020  . NSTEMI (non-ST elevated myocardial infarction) (HCC) 08/27/2018  . Senile purpura (HCC) 07/13/2018  . Hyperparathyroidism, secondary renal (HCC) 12/06/2017  . Vitamin D deficiency 12/06/2017  . Low serum vitamin B12 12/06/2017  . Severe major depression, single episode, without psychotic features (HCC) 12/01/2017  . Advanced care planning/counseling discussion 07/11/2016  . PVD (peripheral vascular disease) (HCC) 12/18/2014  . Hyperlipidemia 12/18/2014  . Osteoporosis 12/18/2014  . Hypertensive CKD (chronic kidney disease) 12/18/2014  . Acute renal failure superimposed on stage 3a chronic kidney disease (HCC) 12/18/2014  . Hypothyroidism 12/18/2014    Orientation RESPIRATION BLADDER Height & Weight     Self, Time, Place  Normal Incontinent Weight: 59 kg Height:  5' (152.4 cm)  BEHAVIORAL SYMPTOMS/MOOD NEUROLOGICAL BOWEL NUTRITION STATUS      Continent    AMBULATORY STATUS COMMUNICATION OF NEEDS Skin   Limited Assist Verbally Normal                       Personal Care Assistance Level of Assistance  Bathing, Feeding, Dressing Bathing  Assistance: Limited assistance Feeding assistance: Limited assistance Dressing Assistance: Limited assistance     Functional Limitations Info             SPECIAL CARE FACTORS FREQUENCY  PT (By licensed PT)     PT Frequency: 5 X weekly              Contractures      Additional Factors Info                  Current Medications (02/26/2020):  This is the current hospital active medication list Current Facility-Administered Medications  Medication Dose Route Frequency Provider Last Rate Last Admin  . 0.9 %  sodium chloride infusion   Intravenous Continuous Lorretta Harp, MD 75 mL/hr at 02/26/20 1300 Rate Verify at 02/26/20 1300  . acetaminophen (TYLENOL) tablet 650 mg  650 mg Oral Q6H PRN Lorretta Harp, MD   650 mg at 02/26/20 1335  . heparin injection 5,000 Units  5,000 Units Subcutaneous Q8H Lynn Ito, MD   5,000 Units at 02/26/20 1335  . hydrALAZINE (APRESOLINE) injection 5 mg  5 mg Intravenous Q2H PRN Lorretta Harp, MD      . ondansetron [facility]) injection 4 mg  4 mg Intravenous Q8H PRN Lorretta Harp, MD         Discharge Medications: Please see discharge summary for a list of discharge medications.  Relevant Imaging Results:  Relevant Lab Results:   Additional Information    Ashley Royalty, Lutricia Feil, RN

## 2020-02-27 DIAGNOSIS — R197 Diarrhea, unspecified: Secondary | ICD-10-CM | POA: Diagnosis not present

## 2020-02-27 DIAGNOSIS — E876 Hypokalemia: Secondary | ICD-10-CM | POA: Diagnosis not present

## 2020-02-27 DIAGNOSIS — N179 Acute kidney failure, unspecified: Secondary | ICD-10-CM | POA: Diagnosis not present

## 2020-02-27 DIAGNOSIS — B351 Tinea unguium: Secondary | ICD-10-CM

## 2020-02-27 DIAGNOSIS — E039 Hypothyroidism, unspecified: Secondary | ICD-10-CM | POA: Diagnosis not present

## 2020-02-27 MED ORDER — LEVOTHYROXINE SODIUM 50 MCG PO TABS
75.0000 ug | ORAL_TABLET | Freq: Every day | ORAL | Status: DC
  Administered 2020-02-28 – 2020-02-29 (×2): 75 ug via ORAL
  Filled 2020-02-27 (×2): qty 2

## 2020-02-27 MED ORDER — LEVOTHYROXINE SODIUM 50 MCG PO TABS: 50.0000 ug | ORAL_TABLET | Freq: Every day | ORAL | Status: DC

## 2020-02-27 NOTE — Progress Notes (Signed)
Mobility Specialist - Progress Note   02/27/20 1600  Mobility  Range of Motion/Exercises Right leg;Left leg (slr, hip add/abd, heel slides)  Level of Assistance Moderate assist, patient does 50-74%  Assistive Device None  Distance Ambulated (ft) 0 ft  Mobility Response Tolerated well  Mobility performed by Mobility specialist  $Mobility charge 1 Mobility    Pre-mobility: 62 HR, 93% SpO2 Post-mobility: 61 HR, 95% SpO2   Pt was sitting in recliner upon arrival. Pt agreed to session. Pt appeared confused as she would ask me, "am I happy today?" Pt needing redirection several times during session, but was very pleasant throughout. Pt was able to perform seated exercises: straight leg raises, heel slides, and hip add/abd with modA. VC were repeated and demonstrations provided to assist with functional performance. OOB mobility deferred this date d/t pt's difficulty in carrying over commands well. Overall, pt tolerated session well. Pt remains in recliner with all needs in reach and alarm set.   Filiberto Pinks Mobility Specialist 02/27/20, 4:18 PM

## 2020-02-27 NOTE — Progress Notes (Addendum)
[facility]  - [place] [facility]   PATIENT NAME: Kristin Juarez    MR#:  [id number]  DATE OF BIRTH:  Aug 05, 1935  SUBJECTIVE:   Patient was brought in with diarrhea Niece lives with patient. No more diarrhea. Tolerating PO diet. Hard on hearing. REVIEW OF SYSTEMS:   Review of Systems  Unable to perform ROS: Other   hard on hearing Tolerating Diet: Tolerating PT:   DRUG ALLERGIES:   Allergies  Allergen Reactions  . Cozaar [Losartan Potassium] Other (See Comments)    Gas   . Lipitor [Atorvastatin] Other (See Comments)    "full of water"  . Zocor [Simvastatin]     VITALS:  Blood pressure 135/63, pulse (!) 56, temperature 98 F (36.7 C), temperature source Oral, resp. rate 16, height 5' (1.524 m), weight 59 kg, SpO2 95 %.  PHYSICAL EXAMINATION:   Physical Exam  General exam: Appears calm and comfortable  Respiratory system: Clear to auscultation. Respiratory effort normal. Cardiovascular system: S1 & S2 heard, RRR. No JVD, murmurs, rubs, gallops or clicks. No pedal edema. Gastrointestinal system: Abdomen is nondistended, soft and nontender.  Normal bowel sounds heard. Central nervous system: difficult to understand her as she is mumbles, but what she is able to hear, she answers appropriately. Extremities: very long toe nails. No edema Skin: warm, dry Psychiatry:  Mood & affect appropriate.in current setting    LABORATORY PANEL:  CBC Recent Labs  Lab 02/26/20 0916  WBC 7.0  HGB 13.5  HCT 40.8  PLT 197    Chemistries  Recent Labs  Lab 02/25/20 1131 02/25/20 1131 02/26/20 0414 02/26/20 0916  NA 136   < >  --  138  K 3.0*   < >  --  3.7  CL 93*   < >  --  104  CO2 30   < >  --  26  GLUCOSE 153*   < >  --  103*  BUN 25*   < >  --  21  CREATININE 1.25*   < >  --  0.92  CALCIUM 9.0   < >  --  7.9*  MG  --   --  2.3  --   AST 28  --   --   --   ALT 15  --   --   --   ALKPHOS 138*  --   --   --   BILITOT 1.5*  --   --   --    <  > = values in this interval not displayed.   Cardiac Enzymes No results for input(s): TROPONINI in the last 168 hours. RADIOLOGY:  No results found. ASSESSMENT AND PLAN:   Anjel Perfetti is a 84 y.o. female with medical history s ignificant of hypertension, hyperlipidemia, PVD, hypothyroidism, CKD stage III, CAD, non-STEMI, possible early stage of dementia, who presents with diarrhea.  Diarrhea:Etiology is not clear could be viral--resolved -C. difficile, GI panel all negative -Mild leukocytosis although may be due to dehydration hemoconcentrated -Continue IV fluids -PT-recommend SNF  Cognitive decline with h/o Depression -pt is alert and awake. -Ms. reports patient had told her she is feeling something different. Not her usual routine. She just retired in December from [facility] -I have discussed with [facility] to see if patient can be evaluated as outpatient for dementia from her primary care physician Dr. Carlynn Purl  Acute renal failure superimposed on stage 3a chronic kidney disease [facility]): Baseline creatinine is about 1.0,  on admission creatinine is 1.25-->0.8 Likely prerenal from dehydration/diarrhea Renal function improved with IV fluids Hold micardis Avoid renal toxic medications  Hypothyroidism: pt is nottaking medications currently for last 1 year per niece TSH elevated at 13, less than her previous TSH in 2020 Restart synthroid 75 mcg qd  Hypokalemia: K=3.0on admission. - Repleted, now stable at 3.7 Mg 2.3  Onychomycosis with ingrown toenails -seen by podiatry and all the nails were clipped at bedside  DVT prophylaxis: start heparin sq Code Status:DNR Family Communication:  spoke with niece Lorene Dy on the phone  Status OE:[id number]  Dispo: The patient is from: Home  Anticipated d/c is to: SNF  Anticipated d/c date is: 1 day  Patient currently is medically stable to d/c.Needs safe discharge  planning, needs SNF.  TOC working on this.       TOTAL TIME TAKING CARE OF THIS PATIENT: *25* minutes.  >50% time spent on counselling and coordination of care  Note: This dictation was prepared with Dragon dictation along with smaller phrase technology. Any transcriptional errors that result from this process are unintentional.  Enedina Finner M.D    [facility]   CC: Primary care physician; Alba Cory, MDPatient ID: Mertie Clause, female   DOB: 05-30-1935, 84 y.o.   MRN: [id number]

## 2020-02-27 NOTE — Progress Notes (Signed)
Initial Nutrition Assessment  DOCUMENTATION CODES:   Not applicable  INTERVENTION:  RD downgraded diet to dysphagia 2 for now. Plan is for SLP consult. Will monitor outcome of SLP recommendations.  RD will recommend appropriate oral nutrition supplements pending SLP recommendations for safe diet texture and liquid consistency.  Recommend obtaining accurate weight.  NUTRITION DIAGNOSIS:   Inadequate oral intake related to decreased appetite as evidenced by per patient/family report.  GOAL:   Patient will meet greater than or equal to 90% of their needs  MONITOR:   PO intake, Supplement acceptance, Labs, Weight trends, I & O's  REASON FOR ASSESSMENT:   Malnutrition Screening Tool    ASSESSMENT:   84 year old female with PMHx of PVD, hypothyroidism, HLD, osteoporosis, CKD, HTN admitted with diarrhea, acute renal failure on CKD stage III.   Met with patient at bedside. Patient was confused and unable to provide any history. Patient was attempting to eat lunch at time of RD assessment and was somewhat impulsive. She was feeding herself with her hands (not using utensils) and was putting more and more raw lettuce (from salad) and cooked broccoli into her mouth without fully chewing what was already in her mouth. NT came to room and had patient spit out what was in her mouth. Patient was eating without her upper partial plate in. Per chart patient ate 50% of her breakfast this morning.  Spoke with patient's niece Kristin Juarez over the phone. She reports patient has had a decreased appetite and intake lately. Patient lives at home with niece. Niece has been offering patient food but lately she has not been wanting to eat much at meals or has been refusing meals altogether. Niece offers her Ensure as well, which patient did drink for a while, but lately had not been drinking as much of. Niece reports patient typically feeds herself with silverware and does not use her hands. She reports  patient also does not typically put too much food in her mouth at a time like she does today. She also reports she usually eats with the partial upper plate in her mouth. She reports patient will likely need softer foods that are easier to chew. Discussed that SLP consult has been placed.  Niece reports patient's UBW is 215 lbs and she does not believe patient has lost any weight. Per review of chart patient has been 80-83 kg for the past several years. She was 83 kg (182.6 lbs) on 10/26/2019. Unsure if current weight of 59 kg (130 lbs) currently documented in chart is accurate.  Medications reviewed and include: levothyroxine, NS at 75 mL/hr.  Labs reviewed.  Patient is at risk for malnutrition.  Discussed with RN. Plan is for RD to downgrade diet for now and then RN will order SLP consult.  NUTRITION - FOCUSED PHYSICAL EXAM:    Most Recent Value  Orbital Region No depletion  Upper Arm Region Mild depletion  Thoracic and Lumbar Region No depletion  Buccal Region No depletion  Temple Region Mild depletion  Clavicle Bone Region No depletion  Clavicle and Acromion Bone Region No depletion  Scapular Bone Region No depletion  Dorsal Hand Mild depletion  Patellar Region No depletion  Anterior Thigh Region No depletion  Posterior Calf Region No depletion  Edema (RD Assessment) None  Hair Reviewed  Eyes Reviewed  Mouth Reviewed  Skin Reviewed  Nails Reviewed     Diet Order:   Diet Order  DIET DYS 2 Room service appropriate? Yes; Fluid consistency: Thin  Diet effective now                EDUCATION NEEDS:   No education needs have been identified at this time  Skin:  Skin Assessment: Reviewed RN Assessment  Last BM:  02/26/2020 - small type 6  Height:   Ht Readings from Last 1 Encounters:  02/25/20 5' (1.524 m)   Weight:   Wt Readings from Last 1 Encounters:  02/25/20 59 kg   BMI:  Body mass index is 25.39 kg/m.  Estimated Nutritional Needs:   Kcal:   2000-2200  Protein:  100-110 grams  Fluid:  2 L/day  Kristin Barnacle, MS, RD, LDN Pager number available on Amion

## 2020-02-27 NOTE — NC FL2 (Addendum)
[place] MEDICAID FL2 LEVEL OF CARE SCREENING TOOL     IDENTIFICATION  Patient Name: Kristin Juarez Birthdate: Apr 11, 1936 Sex: female Admission Date (Current Location): 02/25/2020  [place] and [place] Number:  Chiropodist and Address:  [facility], [address], [place], [place] [address]      Provider Number: [id number]  Attending Physician Name and Address:  Enedina Finner, MD  Relative Name and Phone Number:  [name]    Current Level of Care: Hospital Recommended Level of Care: Skilled Nursing Facility Prior Approval Number:    Date Approved/Denied:  PASRR Number: [id number]  Discharge Plan: SNF    Current Diagnoses: Patient Active Problem List   Diagnosis Date Noted  . Diarrhea 02/25/2020  . CAD (coronary artery disease) 02/25/2020  . Hypokalemia 02/25/2020  . NSTEMI (non-ST elevated myocardial infarction) (HCC) 08/27/2018  . Senile purpura (HCC) 07/13/2018  . Hyperparathyroidism, secondary renal (HCC) 12/06/2017  . Vitamin D deficiency 12/06/2017  . Low serum vitamin B12 12/06/2017  . Severe major depression, single episode, without psychotic features (HCC) 12/01/2017  . Advanced care planning/counseling discussion 07/11/2016  . PVD (peripheral vascular disease) (HCC) 12/18/2014  . Hyperlipidemia 12/18/2014  . Osteoporosis 12/18/2014  . Hypertensive CKD (chronic kidney disease) 12/18/2014  . Acute renal failure superimposed on stage 3a chronic kidney disease (HCC) 12/18/2014  . Hypothyroidism 12/18/2014    Orientation RESPIRATION BLADDER Height & Weight     Self  Normal Incontinent Weight: 130 lb (59 kg) Height:  5' (152.4 cm)  BEHAVIORAL SYMPTOMS/MOOD NEUROLOGICAL BOWEL NUTRITION STATUS   (None)  (None) Incontinent Diet (Heart healthy)  AMBULATORY STATUS COMMUNICATION OF NEEDS Skin   Limited Assist Verbally Bruising, Other (Comment) (Rash.)                       Personal Care Assistance Level of  Assistance  Bathing, Feeding, Dressing Bathing Assistance: Maximum assistance Feeding assistance: Limited assistance Dressing Assistance: Maximum assistance     Functional Limitations Info  Sight, Hearing, Speech Sight Info: Adequate Hearing Info: Adequate Speech Info: Adequate    SPECIAL CARE FACTORS FREQUENCY  PT (By licensed PT), OT (By licensed OT)     PT Frequency: 5 x week OT Frequency: 5 x week            Contractures Contractures Info: Not present    Additional Factors Info  Code Status, Allergies Code Status Info: DNR Allergies Info: Cozaar (Losartan Potassium), Lipitor (Atorvastatin), Zocor (Simvastatin).           Current Medications (02/27/2020):  This is the current hospital active medication list Current Facility-Administered Medications  Medication Dose Route Frequency Provider Last Rate Last Admin  . 0.9 %  sodium chloride infusion   Intravenous Continuous Lorretta Harp, MD 75 mL/hr at 02/26/20 1800 Rate Verify at 02/26/20 1800  . acetaminophen (TYLENOL) tablet 650 mg  650 mg Oral Q6H PRN Lorretta Harp, MD   650 mg at 02/26/20 1335  . heparin injection 5,000 Units  5,000 Units Subcutaneous Q8H Lynn Ito, MD   5,000 Units at 02/27/20 0517  . hydrALAZINE (APRESOLINE) injection 5 mg  5 mg Intravenous Q2H PRN Lorretta Harp, MD      . levothyroxine (SYNTHROID) tablet 50 mcg  50 mcg Oral QAC breakfast Enedina Finner, MD      . ondansetron [facility]) injection 4 mg  4 mg Intravenous Q8H PRN Lorretta Harp, MD         Discharge Medications: Please see  discharge summary for a list of discharge medications.  Relevant Imaging Results:  Relevant Lab Results:   Additional Information SS#: [id number]  Margarito Liner, LCSW

## 2020-02-27 NOTE — TOC Progression Note (Addendum)
Transition of Care [facility]) - Progression Note    Patient Details  Name: Yessenia Maillet MRN: [id number] Date of Birth: [birth year]  Transition of Care [facility]) CM/SW Contact  Margarito Liner, LCSW Phone Number: 02/27/2020, 1:53 PM  Clinical Narrative:  No bed offers at this time. [facility] and [facility] are the only two facilities that have not responded yet. Left voicemail for niece. Will see which is first preference when she calls back.   3:12 pm: Received call back from niece. [facility]. Admissions coordinator is aware and will review. They are not accepting the Medicare waiver.  Expected Discharge Plan: Skilled Nursing Facility Barriers to Discharge: Continued Medical Work up  Expected Discharge Plan and Services Expected Discharge Plan: Skilled Nursing Facility In-house Referral: Clinical Social Work   Post Acute Care Choice: Skilled Nursing Facility Living arrangements for the past 2 months: Single Family Home                                       Social Determinants of Health (SDOH) Interventions    Readmission Risk Interventions No flowsheet data found.

## 2020-02-27 NOTE — Progress Notes (Signed)
Physical Therapy Treatment Patient Details Name: Kristin Juarez MRN: [id number] DOB: Jan 19, 1936 Today's Date: 02/27/2020    History of Present Illness Patient is an 84 year old female who presents to ED for diarrhea with PMH of HTN, HLD, PVD, hypothyroidism, CKD stage III, CAD, non -STEMI, possible early stage dementia, depression. Per niece patient has had diarrhea for more then 2 days and has had a progressive decline in function and cognition over the past 2 months    PT Comments    Patient received in bed, states she is cold, asking for more blankets. She is HOH. Confused. Speaking non-sensically. She is agreeable to getting up to recliner. Requires mod assist with supine to sit. Mainly to assist in scooting to edge of bed abd bringing legs off bed. She requires mod assist for sit to stand from elevated surface. Posterior lean in standing requiring mod assist to correct. She is able to take a few steps from bed to chair with mod assist and RW. Cues and assist needed for safety and to negotiate RW. She will continue to benefit from skilled PT while here to improve functional independence, and safety.   Follow Up Recommendations  SNF     Equipment Recommendations  Other (comment) (TBD next venue)    Recommendations for Other Services OT consult     Precautions / Restrictions Precautions Precautions: Fall Restrictions Weight Bearing Restrictions: No    Mobility  Bed Mobility Overal bed mobility: Needs Assistance Bed Mobility: Supine to Sit     Supine to sit: Mod assist;HOB elevated        Transfers Overall transfer level: Needs assistance Equipment used: Rolling walker (2 wheeled) Transfers: Sit to/from Stand Sit to Stand: Mod assist;From elevated surface         General transfer comment: Attempted from low bed position, was unable, therefore raised bed height. Posterior leaning with initial standing  Ambulation/Gait Ambulation/Gait assistance: Mod assist Gait  Distance (Feet): 4 Feet Assistive device: Rolling walker (2 wheeled) Gait Pattern/deviations: Step-to pattern;Decreased step length - right;Decreased step length - left;Shuffle Gait velocity: decr   General Gait Details: patient requires mod assist for balance and to navigate RW. Cues needed for safety during mobility as she attempts to reach for recliner prematurely. Sat down prematurely as well.   Stairs             Wheelchair Mobility    Modified Rankin (Stroke Patients Only)       Balance Overall balance assessment: Needs assistance Sitting-balance support: Feet supported Sitting balance-Leahy Scale: Fair Sitting balance - Comments: able to sit static unsupported.     Standing balance-Leahy Scale: Poor Standing balance comment: Requires RW and min/mod assist for balance due to posterior lean                            Cognition Arousal/Alertness: Awake/alert Behavior During Therapy: WFL for tasks assessed/performed Overall Cognitive Status: No family/caregiver present to determine baseline cognitive functioning                                 General Comments: Follows 1 step commands c repetitions and increased time      Exercises      General Comments        Pertinent Vitals/Pain Pain Assessment: No/denies pain    Home Living  Prior Function            PT Goals (current goals can now be found in the care plan section) Acute Rehab PT Goals Patient Stated Goal: to go to SNF PT Goal Formulation: With patient/family Time For Goal Achievement: 03/11/20 Potential to Achieve Goals: Good Progress towards PT goals: Progressing toward goals    Frequency    Min 2X/week      PT Plan Current plan remains appropriate    Co-evaluation              AM-PAC PT "6 Clicks" Mobility   Outcome Measure  Help needed turning from your back to your side while in a flat bed without using bedrails?:  A Little Help needed moving from lying on your back to sitting on the side of a flat bed without using bedrails?: A Lot Help needed moving to and from a bed to a chair (including a wheelchair)?: A Lot Help needed standing up from a chair using your arms (e.g., wheelchair or bedside chair)?: A Lot Help needed to walk in hospital room?: A Lot Help needed climbing 3-5 steps with a railing? : Total 6 Click Score: 12    End of Session Equipment Utilized During Treatment: Gait belt Activity Tolerance: Patient tolerated treatment well Patient left: in chair;with call bell/phone within reach;with chair alarm set Nurse Communication: Mobility status PT Visit Diagnosis: Unsteadiness on feet (R26.81);Muscle weakness (generalized) (M62.81);Other abnormalities of gait and mobility (R26.89);Difficulty in walking, not elsewhere classified (R26.2)     Time: 1020-1036 PT Time Calculation (min) (ACUTE ONLY): 16 min  Charges:  $Gait Training: 8-22 mins                     Icker Swigert, PT, GCS 02/27/20,10:56 AM

## 2020-02-28 DIAGNOSIS — E039 Hypothyroidism, unspecified: Secondary | ICD-10-CM | POA: Diagnosis not present

## 2020-02-28 DIAGNOSIS — R197 Diarrhea, unspecified: Secondary | ICD-10-CM | POA: Diagnosis not present

## 2020-02-28 DIAGNOSIS — E876 Hypokalemia: Secondary | ICD-10-CM | POA: Diagnosis not present

## 2020-02-28 DIAGNOSIS — N179 Acute kidney failure, unspecified: Secondary | ICD-10-CM | POA: Diagnosis not present

## 2020-02-28 LAB — SARS CORONAVIRUS 2 BY RT PCR (HOSPITAL ORDER, PERFORMED IN ~~LOC~~ HOSPITAL LAB): SARS Coronavirus 2: NEGATIVE

## 2020-02-28 MED ORDER — ENSURE ENLIVE PO LIQD
237.0000 mL | Freq: Two times a day (BID) | ORAL | Status: DC
  Administered 2020-02-28 – 2020-02-29 (×2): 237 mL via ORAL

## 2020-02-28 NOTE — TOC Progression Note (Signed)
Transition of Care [facility]) - Progression Note    Patient Details  Name: Kristin Juarez MRN: [id number] Date of Birth: 09-27-1935  [facility]) CM/SW Contact  Margarito Liner, LCSW Phone Number: 02/28/2020, 9:17 AM  Clinical Narrative: [facility] is able to offer patient a bed and said they should have one tomorrow once she has had her 3 midnights under inpatient status. Niece is aware and agreeable. She said patient has not had her COVID vaccines. CSW made her aware of 14-day quarantine period. MD will order a COVID test today.  Expected Discharge Plan: Skilled Nursing Facility Barriers to Discharge: Continued Medical Work up  Expected Discharge Plan and Services Expected Discharge Plan: Skilled Nursing Facility In-house Referral: Clinical Social Work   Post Acute Care Choice: Skilled Nursing Facility Living arrangements for the past 2 months: Single Family Home                                       Social Determinants of Health (SDOH) Interventions    Readmission Risk Interventions No flowsheet data found.

## 2020-02-28 NOTE — Evaluation (Addendum)
Clinical/Bedside Swallow Evaluation Patient Details  Name: Kristin Juarez MRN: [id number] Date of Birth: [birth year]  Today's Date: 02/28/2020 Time: SLP Start Time (ACUTE ONLY): 1300 SLP Stop Time (ACUTE ONLY): 1330 SLP Time Calculation (min) (ACUTE ONLY): 30 min  Past Medical History:  Past Medical History:  Diagnosis Date  . Hyperlipidemia   . Hypertension   . Hypertensive CKD (chronic kidney disease)   . Hypothyroid   . Osteoporosis   . PVD (peripheral vascular disease) (HCC)    Past Surgical History:  Past Surgical History:  Procedure Laterality Date  . ABDOMINAL HYSTERECTOMY    . CLEFT PALATE REPAIR     HPI:  Pt is a 84 y.o. female with medical history significant of hypertension, hyperlipidemia, PVD, hypothyroidism, CKD stage III, CAD, non-STEMI, Dementia (per MD note), who presents with diarrhea. Brain CT (08/27/2018): Hypodensity in the white matter is suggestive for chronic small vessel ischemic disease.  Assessment / Plan / Recommendation Clinical Impression  Pt was seen for bedside swallow evaluation. No Overt Clinical s/s of Pharyngeal Dysphagia/Aspiration noted. Swallow evaluation was limited by: pt's apparent confusion/cognitive status, and pt finishing lunch meal upon clinician arrival. Unsure of pt cognitive baseline, per MD note "possibly early stage of dementia". Brain CT (08/27/2018): Hypodensity in the white matter is suggestive for chronic small vessel ischemic disease. Pt appears w/ feeding difficulties secondary to moderate cognitive decline, and edentulous status. Of note, any oral phase Dysphagia can increase risk for pharyngeal phase Dysphagia. Of note, NSG has noted impulsive feeding behaviors during this admission.   Pt was in chair with meal tray upon clinician arrival. Cursory oral mech exam reveals pt lingual strength/ROM grossly adequate; labial seal appears adequate. Pt vocal quality appears min reduced in intensity (loudness) baseline. Pt was finishing  meal and had eaten mostly puree foods from her current meal tray (Dysphagia 2: minced+ purees). She was observed directly with PO's of puree (10x) via spoon, and thin liquids (8x) via straw; No Overt Clinical s/s of Aspiration Noted w/ all consistencies. Voice clear between/after all PO consistencies; oral phase was adequate for bolus management/clearing, with the consistencies she consumed (puree, thin). On the meal tray, it appeared pt ate less of the textured foods & more of the puree foods. Pt did not require positioning support with PO's as she was already positioned upright in chair upon clinician arrival to room. Pt was able to self-feed all trial consistencies w/ mod set-up assist; emphasis on reduction of distractions (muted TV, reduce clutter around meal). Pt appeared min-emotionally labile, w/ some agitation noted as pt indicated she was "ready to lay down in bed" multiple times throughout the evaluation. NSG was called to assist pt to bed after PO's were assessed w/ SLP. Pt was left in bed resting at end of session.   Recommend Dysphagia 1 diet (Puree) w/ thin liquids d/t pt's apparent cognitive impact & decline, and edentulous condition. This consistency vs. current (Dysphagia 2/Minced) may allow for increased nutritional intake, d/t  decreased cognitive/motoric demands inherent to the task of eating puree vs. minced foods. Pt also appears to gravitate toward puree consistencies at this time based on observation of her meal tray-- this may be a more supportive diet consistency for pt at this time. MD/NSG updated. Aspiration Precautions posted in room.   NSG to re-consult ST services should any further needs arise/improvements in pt condition warrant upgraded diet during admission or post-admission at next venue of care.    SLP Visit Diagnosis: Dysphagia, unspecified (R13.10)  Aspiration Risk  Mild aspiration risk;Risk for inadequate nutrition/hydration but reduced following general  aspiration precautions; supervision for support as needed   Diet Recommendation   Dysphagia 1 (puree) w/ thin liquids; general aspiration precautions; Supervision w/ meals as needed, tray setup, support  Medication Administration: CRUSHED meds with puree for safer swallowing    Other  Recommendations Oral Care Recommendations: Oral care BID   Follow up Recommendations None      Frequency and Duration   N/a         Prognosis Prognosis for Safe Diet Advancement: Guarded Barriers to Reach Goals: Cognitive deficits;Time post onset;Severity of deficits;Behavior      Swallow Study   General Date of Onset: 02/25/20 HPI: Pt is a 83 y.o. female with medical history significant of hypertension, hyperlipidemia, PVD, hypothyroidism, CKD stage III, CAD, non-STEMI, possible early stage of dementia, who presents with diarrhea. Type of Study: Bedside Swallow Evaluation Previous Swallow Assessment: n/a Diet Prior to this Study: Dysphagia 2 (chopped);Thin liquids Temperature Spikes Noted: No Respiratory Status: Room air History of Recent Intubation: No Behavior/Cognition: Alert;Cooperative;Confused;Agitated;Distractible;Requires cueing Oral Cavity Assessment: Within Functional Limits Oral Care Completed by SLP: No Oral Cavity - Dentition: Dentures, not available;Poor condition;Missing dentition Vision: Functional for self-feeding Self-Feeding Abilities: Able to feed self with adaptive devices;Needs assist;Needs set up Patient Positioning: Upright in chair Baseline Vocal Quality: Normal;Low vocal intensity    Oral/Motor/Sensory Function Overall Oral Motor/Sensory Function: Mild impairment (decreased awareness) Facial ROM: Within Functional Limits Facial Symmetry: Within Functional Limits Facial Strength: Within Functional Limits Facial Sensation: Within Functional Limits Lingual ROM: Within Functional Limits Lingual Symmetry: Within Functional Limits Lingual Strength: Within Functional  Limits Lingual Sensation: Within Functional Limits Velum: Within Functional Limits Mandible: Within Functional Limits   Ice Chips Ice chips: Not tested   Thin Liquid Thin Liquid: Within functional limits Presentation: Cup;Self Fed;Straw Other Comments: 8x    Nectar Thick Nectar Thick Liquid: Not tested   Honey Thick Honey Thick Liquid: Not tested   Puree Puree: Within functional limits Presentation: Self Fed;Spoon Other Comments: 10x   Solid     Solid: Not tested      Oliver Pila  Graduate Clinician 02/28/2020,1:46 PM   The information in this patient note, response to treatment, and overall treatment plan developed has been reviewed and agreed upon by this clinician.  Jerilynn Som, MS, CCC-SLP Speech Language Pathologist Rehab Services [phone]

## 2020-02-28 NOTE — Progress Notes (Signed)
Mobility Specialist - Progress Note   02/28/20 1133  Mobility  Range of Motion/Exercises  (STSx2, washed face)  Level of Assistance Moderate assist, patient does 50-74%  Assistive Device Front wheel walker  Distance Ambulated (ft) 0 ft  Mobility Response Tolerated well  Mobility performed by Mobility specialist  $Mobility charge 1 Mobility    Pre-mobility: 67 HR, 92% SpO2 Post-mobility: 74 HR, 96% SpO2   Pt was sitting in recliner utilizing room air upon arrival. Pt agreed to session. Pt initially declined ambulation d/t back pain. Pt was able to wash face with set-up assist from mobility. Pt then stated she wants to walk. Pt attempted to stand to RW twice. Pt was able to elevate trunk from surface both attempts, but struggled to straighten upright posture with max cueing. Pt presented with anterior lean. Pt then sat back into chair quickly d/t pain. Overall, pt tolerated session well. Pt wants to attempt ambulation later this afternoon.    Filiberto Pinks Mobility Specialist 02/28/20, 11:38 AM

## 2020-02-28 NOTE — Progress Notes (Signed)
[facility]  - [place] at [facility]   PATIENT NAME: Kristin Juarez    MR#:  [id number]  DATE OF BIRTH:  1935/07/31  SUBJECTIVE:   Patient was brought in with diarrhea Niece lives with patient. No more diarrhea. Tolerating PO diet. Hard on hearing. Some intermittent confusion REVIEW OF SYSTEMS:   Review of Systems  Unable to perform ROS: Other   hard on hearing, confusion Tolerating Diet:yes Tolerating PT: rec rehab  DRUG ALLERGIES:   Allergies  Allergen Reactions  . Cozaar [Losartan Potassium] Other (See Comments)    Gas   . Lipitor [Atorvastatin] Other (See Comments)    "full of water"  . Zocor [Simvastatin]     VITALS:  Blood pressure 139/76, pulse 67, temperature (!) 97.4 F (36.3 C), temperature source Oral, resp. rate 18, height 5' (1.524 m), weight 59 kg, SpO2 97 %.  PHYSICAL EXAMINATION:   Physical Exam  General exam: Appears calm and comfortable ,HOH Respiratory system: Clear to auscultation. Respiratory effort normal. Cardiovascular system: S1 & S2 heard, RRR. No JVD, murmurs, rubs, gallops or clicks. No pedal edema. Gastrointestinal system: Abdomen is nondistended, soft and nontender.  Normal bowel sounds heard. Central nervous system: difficult to understand her as she is mumbles, but what she is able to hear, she answers appropriately. Extremities: very long toe nails. No edema Skin: warm, dry Psychiatry:  Mood & affect appropriate  LABORATORY PANEL:  CBC Recent Labs  Lab 02/26/20 0916  WBC 7.0  HGB 13.5  HCT 40.8  PLT 197    Chemistries  Recent Labs  Lab 02/25/20 1131 02/25/20 1131 02/26/20 0414 02/26/20 0916  NA 136   < >  --  138  K 3.0*   < >  --  3.7  CL 93*   < >  --  104  CO2 30   < >  --  26  GLUCOSE 153*   < >  --  103*  BUN 25*   < >  --  21  CREATININE 1.25*   < >  --  0.92  CALCIUM 9.0   < >  --  7.9*  MG  --   --  2.3  --   AST 28  --   --   --   ALT 15  --   --   --   ALKPHOS 138*  --   --   --    BILITOT 1.5*  --   --   --    < > = values in this interval not displayed.   Cardiac Enzymes No results for input(s): TROPONINI in the last 168 hours. RADIOLOGY:  No results found. ASSESSMENT AND PLAN:   [name] is a 84 y.o. female with medical history s ignificant of hypertension, hyperlipidemia, PVD, hypothyroidism, CKD stage III, CAD, non-STEMI, possible early stage of dementia, who presents with diarrhea.  Diarrhea:Etiology is not clear could be viral--resolved -C. difficile, GI panel all negative -Mild leukocytosis although may be due to dehydration hemoconcentrated -recieved IV fluids -PT-recommend SNF  Acute renal failure superimposed on stage 3a chronic kidney disease (HCC): Baseline creatinine is about 1.0, on admission creatinine is 1.25-->0.8 Likely prerenal from dehydration/diarrhea Renal function improved with IV fluids Hold micardis Avoid renal toxic medications  Cognitive decline with h/o Depression -pt is alert and awake. -Ms. reports patient had told her she is feeling something different. Not her usual routine. She just retired in December from [place] [facility] -I have  discussed with Tennis Ship to see if patient can be evaluated as outpatient for dementia from her primary care physician Dr. Carlynn Purl  Hypothyroidism: pt is nottaking medications currently for last 1 year per niece TSH elevated at 80, less than her previous TSH in 2020 Restart synthroid 75 mcg qd  Hypokalemia: K=3.0on admission. - Repleted, now stable at 3.7 Mg 2.3  Onychomycosis with ingrown toenails -seen by podiatry and all the nails were clipped at bedside  DVT prophylaxis: on heparin sq Code Status:DNR Family Communication:  spoke with niece kristy on the phone  Status JX:[id number]  Dispo: The patient is from: Home  Anticipated d/c is to: SNF  Anticipated d/c date is:oct 27th  Patient currently is medically stable to  d/c. Patient will discharged October 27. She requires three night Medicare stay in the hospital.      TOTAL TIME TAKING CARE OF THIS PATIENT: *20* minutes.  >50% time spent on counselling and coordination of care  Note: This dictation was prepared with Dragon dictation along with smaller phrase technology. Any transcriptional errors that result from this process are unintentional.  Enedina Finner M.D    [facility]   CC: Primary care physician; Alba Cory, MDPatient ID: Kristin Juarez, female   DOB: [birth year], 84 y.o.   MRN: [id number]

## 2020-02-29 DIAGNOSIS — B351 Tinea unguium: Secondary | ICD-10-CM

## 2020-02-29 DIAGNOSIS — R197 Diarrhea, unspecified: Secondary | ICD-10-CM | POA: Diagnosis not present

## 2020-02-29 DIAGNOSIS — E876 Hypokalemia: Secondary | ICD-10-CM | POA: Diagnosis not present

## 2020-02-29 DIAGNOSIS — E039 Hypothyroidism, unspecified: Secondary | ICD-10-CM | POA: Diagnosis not present

## 2020-02-29 DIAGNOSIS — N179 Acute kidney failure, unspecified: Secondary | ICD-10-CM | POA: Diagnosis not present

## 2020-02-29 MED ORDER — AMLODIPINE BESYLATE 2.5 MG PO TABS: 2.5000 mg | ORAL_TABLET | Freq: Every day | ORAL | Status: AC

## 2020-02-29 MED ORDER — LEVOTHYROXINE SODIUM 75 MCG PO TABS: 75.0000 ug | ORAL_TABLET | Freq: Every day | ORAL | Status: AC

## 2020-02-29 MED ORDER — ENSURE ENLIVE PO LIQD: 237.0000 mL | Freq: Two times a day (BID) | ORAL | 12 refills | Status: AC

## 2020-02-29 MED ORDER — AMLODIPINE BESYLATE 5 MG PO TABS
2.5000 mg | ORAL_TABLET | Freq: Every day | ORAL | Status: DC
  Administered 2020-02-29: 2.5 mg via ORAL
  Filled 2020-02-29: qty 1

## 2020-02-29 NOTE — TOC Transition Note (Signed)
Transition of Care [facility]) - CM/SW Discharge Note   Patient Details  Name: Kristin Juarez MRN: [id number] Date of Birth: [birth year]  [facility] CM/SW Contact:  Margarito Liner, LCSW Phone Number: 02/29/2020, 12:11 PM   Clinical Narrative:  Patient has orders to discharge to [facility] today. RN will call report to [phone] (Room 708). EMS has been set up for 1:30 pm. No further concerns. CSW signing off.   Final next level of care: Skilled Nursing Facility Barriers to Discharge: Barriers Resolved   Patient Goals and CMS Choice   CMS Medicare.gov Compare Post Acute Care list provided to:: Patient Represenative (must comment) (POA niece-Kristy Prario) Choice offered to / list presented to : [facility] POA / Guardian  Discharge Placement PASRR number recieved: 02/27/20            Patient chooses bed at: [facility] [place] Patient to be transferred to facility by: EMS Name of family member notified: [name] Patient and family notified of of transfer: 02/29/20  Discharge Plan and Services In-house Referral: Clinical Social Work   Post Acute Care Choice: Skilled Nursing Facility                               Social Determinants of Health (SDOH) Interventions     Readmission Risk Interventions No flowsheet data found.

## 2020-02-29 NOTE — Care Management Important Message (Signed)
Important Message  Patient Details  Name: Kristin Juarez MRN: [id number] Date of Birth: [birth year]   Medicare Important Message Given:  Yes     [name] 02/29/2020, 11:05 AM

## 2020-02-29 NOTE — Progress Notes (Signed)
Mertie Clause to be D/C'd [facility] per MD order.  Discussed prescriptions and follow up appointments with receiving nurse. Nurse verbalized understanding. SNF will pick up medication for patient  Allergies as of 02/29/2020       Reactions   Cozaar [losartan Potassium] Other (See Comments)   Gas   Lipitor [atorvastatin] Other (See Comments)   "full of water"   Zocor [simvastatin]         Medication List     TAKE these medications    amLODipine 2.5 MG tablet Commonly known as: NORVASC Take 1 tablet (2.5 mg total) by mouth daily.   feeding supplement Liqd Take 237 mLs by mouth 2 (two) times daily between meals.   levothyroxine 75 MCG tablet Commonly known as: SYNTHROID Take 1 tablet (75 mcg total) by mouth daily before breakfast. Start taking on: March 01, 2020        Vitals:   02/29/20 0759 02/29/20 1259  BP: (!) 148/85 (!) 145/82  Pulse: 66 72  Resp: 18 16  Temp: 97.8 F (36.6 C) 98.5 F (36.9 C)  SpO2: 98% 96%    Skin clean, dry and intact without evidence of skin break down, no evidence of skin tears noted. IV catheter discontinued intact. Site without signs and symptoms of complications. Dressing and pressure applied. Pt denies pain at this time. No complaints noted.  An After Visit Summary was printed and given to the patient. Patient picked up by EMS.  Clarke Amburn A Emeline Simpson

## 2020-02-29 NOTE — Discharge Summary (Signed)
Kristin Juarez WUJ:[id number] DOB: 1935/12/17 DOA: 02/25/2020  PCP: Alba Cory, MD  Admit date: 02/25/2020 Discharge date: 02/29/2020  Admitted From: home Disposition:  [facility]  Recommendations for Outpatient Follow-up:  1. Follow up with PCP in 1 week 2. Please obtain BMP/CBC in one week      Discharge Condition:Stable CODE STATUS:DNR  Diet recommendation: Dysphagia 1, add gravies to meats, potatoes. Likes magic cups.   Brief/Interim Summary: Kristin Juarez is a 84 y.o. female with medical history significant of hypertension, hyperlipidemia, PVD, hypothyroidism, CKD stage III, CAD, non-STEMI, possible early stage of dementia, who presents with diarrhea.  Diarrhea:Etiology is not clear could be viral--resolved -C. difficile, GI panel all negative -Mild leukocytosis although may be due to dehydration hemoconcentrated -recieved IV fluids  Acute renal failure superimposed on stage 3a chronic kidney disease (HCC): Baseline creatinine is about 1.0,on admissioncreatinine is 1.25-->0.8 Likely prerenal from dehydration/diarrhea Renal function improved with IV fluids D/c'd micardis Avoid renal toxic medications  Cognitive decline with h/o Depression  She just retired in December from [facility] [facility] [facility] -patient can be evaluated as outpatient for dementia from her primary care physician Dr. Carlynn Purl  Hypothyroidism: pt is nottaking medications currently for last 1 year per niece TSH elevated at 82, less than her previous TSH in 2020 Restarted synthroid 75 mcg qd Should get TSH checked in 4 weeks  Hypokalemia: K=3.0on admission.  now stable. Monitor as outpt. Mg 2.3  Onychomycosis with ingrown toenails -seen by podiatry and all the nails were clipped at bedside   HTN- started on amlodipine, increase as tolerated.   Discharge Diagnoses:  Principal Problem:   Diarrhea Active Problems:   Acute renal failure superimposed on stage 3a chronic kidney  disease (HCC)   Hypothyroidism   CAD (coronary artery disease)   Hypokalemia   Onychomycosis    Discharge Instructions   Allergies as of 02/29/2020      Reactions   Cozaar [losartan Potassium] Other (See Comments)   Gas   Lipitor [atorvastatin] Other (See Comments)   "full of water"   Zocor [simvastatin]       Medication List    TAKE these medications   amLODipine 2.5 MG tablet Commonly known as: NORVASC Take 1 tablet (2.5 mg total) by mouth daily.   feeding supplement Liqd Take 237 mLs by mouth 2 (two) times daily between meals.   levothyroxine 75 MCG tablet Commonly known as: SYNTHROID Take 1 tablet (75 mcg total) by mouth daily before breakfast. Start taking on: March 01, 2020       Contact information for after-discharge care    Destination    [facility] Preferred SNF .   Service: Skilled Nursing Contact information: [address] [place] [address] [phone]                 Allergies  Allergen Reactions   Cozaar [Losartan Potassium] Other (See Comments)    Gas    Lipitor [Atorvastatin] Other (See Comments)    "full of water"   Zocor [Simvastatin]     Consultations:  podiatry   Procedures/Studies: DG Chest Port 1 View  Result Date: 02/25/2020 CLINICAL DATA:  Weakness. EXAM: PORTABLE CHEST 1 VIEW COMPARISON:  August 27, 2018. FINDINGS: The heart size and mediastinal contours are within normal limits. Both lungs are clear. Stable elevated right hemidiaphragm is noted. No pneumothorax or pleural effusion is noted. The visualized skeletal structures are unremarkable. IMPRESSION: No active disease. Aortic Atherosclerosis (ICD10-I70.0). Electronically Signed   By:  Lupita Raider M.D.   On: 02/25/2020 12:34       Subjective: No complaints  Discharge Exam: Vitals:   02/29/20 0500 02/29/20 0759  BP: 139/76 (!) 148/85  Pulse: 71 66  Resp: 17 18  Temp: 99 F (37.2 C) 97.8 F (36.6 C)  SpO2: 96%  98%   Vitals:   02/28/20 2033 02/28/20 2343 02/29/20 0500 02/29/20 0759  BP: (!) 162/97 (!) 159/87 139/76 (!) 148/85  Pulse: 65 69 71 66  Resp: 18 16 17 18   Temp: 98.2 F (36.8 C) 98 F (36.7 C) 99 F (37.2 C) 97.8 F (36.6 C)  TempSrc: Oral Oral Oral Oral  SpO2: 96% 95% 96% 98%  Weight:      Height:        General: Pt is alert, awake, not in acute distress Cardiovascular: RRR, S1/S2 +, no rubs, no gallops Respiratory: CTA bilaterally, no wheezing, no rhonchi Abdominal: Soft, NT, ND, bowel sounds + Extremities: no edema, no cyanosis    The results of significant diagnostics from this hospitalization (including imaging, microbiology, ancillary and laboratory) are listed below for reference.     Microbiology: Recent Results (from the past 240 hour(s))  Urine culture     Status: None   Collection Time: 02/25/20 11:31 AM   Specimen: Urine, Random  Result Value Ref Range Status   Specimen Description   Final    URINE, RANDOM Performed at [facility], [address], [place], [place]    Special Requests   Final    NONE Performed at [facility], [address], [place], [place]    Culture   Final    NO GROWTH Performed at [facility], [address] [place], [place]    Report Status 02/26/2020 FINAL  Final  C Difficile Quick Screen w PCR reflex     Status: None   Collection Time: 02/25/20 11:31 AM   Specimen: STOOL  Result Value Ref Range Status   C Diff antigen NEGATIVE NEGATIVE Final   C Diff toxin NEGATIVE NEGATIVE Final   C Diff interpretation No C. difficile detected.  Final    Comment: Performed at [facility], [address] [place], [place]  Gastrointestinal Panel by PCR , Stool     Status: None   Collection Time: 02/25/20 11:31 AM   Specimen: STOOL  Result Value Ref Range Status   Campylobacter species NOT DETECTED NOT DETECTED Final   Plesimonas shigelloides NOT DETECTED  NOT DETECTED Final   Salmonella species NOT DETECTED NOT DETECTED Final   Yersinia enterocolitica NOT DETECTED NOT DETECTED Final   Vibrio species NOT DETECTED NOT DETECTED Final   Vibrio cholerae NOT DETECTED NOT DETECTED Final   Enteroaggregative E coli (EAEC) NOT DETECTED NOT DETECTED Final   Enteropathogenic E coli (EPEC) NOT DETECTED NOT DETECTED Final   Enterotoxigenic E coli (ETEC) NOT DETECTED NOT DETECTED Final   Shiga like toxin producing E coli (STEC) NOT DETECTED NOT DETECTED Final   Shigella/Enteroinvasive E coli (EIEC) NOT DETECTED NOT DETECTED Final   Cryptosporidium NOT DETECTED NOT DETECTED Final   Cyclospora cayetanensis NOT DETECTED NOT DETECTED Final   Entamoeba histolytica NOT DETECTED NOT DETECTED Final   Giardia lamblia NOT DETECTED NOT DETECTED Final   Adenovirus F40/41 NOT DETECTED NOT DETECTED Final   Astrovirus NOT DETECTED NOT DETECTED Final   Norovirus GI/GII NOT DETECTED NOT DETECTED Final   Rotavirus A NOT DETECTED NOT  DETECTED Final   Sapovirus (I, II, IV, and V) NOT DETECTED NOT DETECTED Final    Comment: Performed at [facility], [address] [place], [place] [address]  Respiratory Panel by RT PCR (Flu A&B, Covid) - Nasopharyngeal Swab     Status: None   Collection Time: 02/25/20 11:31 AM   Specimen: Nasopharyngeal Swab  Result Value Ref Range Status   SARS Coronavirus 2 by RT PCR NEGATIVE NEGATIVE Final    Comment: (NOTE) SARS-CoV-2 target nucleic acids are NOT DETECTED.  The SARS-CoV-2 RNA is generally detectable in upper respiratoy specimens during the acute phase of infection. The lowest concentration of SARS-CoV-2 viral copies this assay can detect is 131 copies/mL. A negative result does not preclude SARS-Cov-2 infection and should not be used as the sole basis for treatment or other patient management decisions. A negative result may occur with  improper specimen collection/handling, submission of specimen other than  nasopharyngeal swab, presence of viral mutation(s) within the areas targeted by this assay, and inadequate number of viral copies (<131 copies/mL). A negative result must be combined with clinical observations, patient history, and epidemiological information. The expected result is Negative.  Fact Sheet for Patients:  https://www.moore.com/  Fact Sheet for Healthcare Providers:  https://www.young.biz/  This test is no t yet approved or cleared by the [place] FDA and  has been authorized for detection and/or diagnosis of SARS-CoV-2 by FDA under an Emergency Use Authorization (EUA). This EUA will remain  in effect (meaning this test can be used) for the duration of the COVID-19 declaration under Section 564(b)(1) of the Act, 21 U.S.C. section 360bbb-3(b)(1), unless the authorization is terminated or revoked sooner.     Influenza A by PCR NEGATIVE NEGATIVE Final   Influenza B by PCR NEGATIVE NEGATIVE Final    Comment: (NOTE) The Xpert Xpress SARS-CoV-2/FLU/RSV assay is intended as an aid in  the diagnosis of influenza from Nasopharyngeal swab specimens and  should not be used as a sole basis for treatment. Nasal washings and  aspirates are unacceptable for Xpert Xpress SARS-CoV-2/FLU/RSV  testing.  Fact Sheet for Patients: https://www.moore.com/  Fact Sheet for Healthcare Providers: https://www.young.biz/  This test is not yet approved or cleared by the [place] FDA and  has been authorized for detection and/or diagnosis of SARS-CoV-2 by  FDA under an Emergency Use Authorization (EUA). This EUA will remain  in effect (meaning this test can be used) for the duration of the  Covid-19 declaration under Section 564(b)(1) of the Act, 21  U.S.C. section 360bbb-3(b)(1), unless the authorization is  terminated or revoked. Performed at [facility], [address] [place], [place]  [address]   SARS Coronavirus 2 by RT PCR (hospital order, performed in [facility] hospital lab) Nasopharyngeal Nasopharyngeal Swab     Status: None   Collection Time: 02/28/20  5:12 PM   Specimen: Nasopharyngeal Swab  Result Value Ref Range Status   SARS Coronavirus 2 NEGATIVE NEGATIVE Final    Comment: (NOTE) SARS-CoV-2 target nucleic acids are NOT DETECTED.  The SARS-CoV-2 RNA is generally detectable in upper and lower respiratory specimens during the acute phase of infection. The lowest concentration of SARS-CoV-2 viral copies this assay can detect is 250 copies / mL. A negative result does not preclude SARS-CoV-2 infection and should not be used as the sole basis for treatment or other patient management decisions.  A negative result may occur with improper specimen collection / handling, submission of specimen other than nasopharyngeal swab,  presence of viral mutation(s) within the areas targeted by this assay, and inadequate number of viral copies (<250 copies / mL). A negative result must be combined with clinical observations, patient history, and epidemiological information.  Fact Sheet for Patients:   BoilerBrush.com.cyhttps://www.fda.gov/media/136312/download  Fact Sheet for Healthcare Providers: https://pope.com/https://www.fda.gov/media/136313/download  This test is not yet approved or  cleared by the [place] FDA and has been authorized for detection and/or diagnosis of SARS-CoV-2 by FDA under an Emergency Use Authorization (EUA).  This EUA will remain in effect (meaning this test can be used) for the duration of the COVID-19 declaration under Section 564(b)(1) of the Act, 21 U.S.C. section 360bbb-3(b)(1), unless the authorization is terminated or revoked sooner.  Performed at [facility], [address] [place], [place] [address]      Labs: BNP (last 3 results) No results for input(s): BNP in the last 8760 hours. Basic Metabolic Panel: Recent Labs  Lab 02/25/20 1131  02/26/20 0414 02/26/20 0916  NA 136  --  138  K 3.0*  --  3.7  CL 93*  --  104  CO2 30  --  26  GLUCOSE 153*  --  103*  BUN 25*  --  21  CREATININE 1.25*  --  0.92  CALCIUM 9.0  --  7.9*  MG  --  2.3  --    Liver Function Tests: Recent Labs  Lab 02/25/20 1131  AST 28  ALT 15  ALKPHOS 138*  BILITOT 1.5*  PROT 6.8  ALBUMIN 3.1*   No results for input(s): LIPASE, AMYLASE in the last 168 hours. No results for input(s): AMMONIA in the last 168 hours. CBC: Recent Labs  Lab 02/25/20 1131 02/26/20 0916  WBC 11.9* 7.0  HGB 17.4* 13.5  HCT 52.2* 40.8  MCV 85.6 88.5  PLT 243 197   Cardiac Enzymes: No results for input(s): CKTOTAL, CKMB, CKMBINDEX, TROPONINI in the last 168 hours. BNP: Invalid input(s): POCBNP CBG: No results for input(s): GLUCAP in the last 168 hours. D-Dimer No results for input(s): DDIMER in the last 72 hours. Hgb A1c No results for input(s): HGBA1C in the last 72 hours. Lipid Profile No results for input(s): CHOL, HDL, LDLCALC, TRIG, CHOLHDL, LDLDIRECT in the last 72 hours. Thyroid function studies No results for input(s): TSH, T4TOTAL, T3FREE, THYROIDAB in the last 72 hours.  Invalid input(s): FREET3 Anemia work up No results for input(s): VITAMINB12, FOLATE, FERRITIN, TIBC, IRON, RETICCTPCT in the last 72 hours. Urinalysis    Component Value Date/Time   COLORURINE AMBER (A) 02/25/2020 1131   APPEARANCEUR HAZY (A) 02/25/2020 1131   LABSPEC 1.024 02/25/2020 1131   PHURINE 5.0 02/25/2020 1131   GLUCOSEU NEGATIVE 02/25/2020 1131   HGBUR NEGATIVE 02/25/2020 1131   BILIRUBINUR SMALL (A) 02/25/2020 1131   KETONESUR NEGATIVE 02/25/2020 1131   PROTEINUR 30 (A) 02/25/2020 1131   NITRITE NEGATIVE 02/25/2020 1131   LEUKOCYTESUR NEGATIVE 02/25/2020 1131   Sepsis Labs Invalid input(s): PROCALCITONIN,  WBC,  LACTICIDVEN Microbiology Recent Results (from the past 240 hour(s))  Urine culture     Status: None   Collection Time: 02/25/20 11:31 AM    Specimen: Urine, Random  Result Value Ref Range Status   Specimen Description   Final    URINE, RANDOM Performed at [facility], [address] [place], [place] [address]    Special Requests   Final    NONE Performed at [facility], [address] [place], [place] [address]    Culture  Final    NO GROWTH Performed at [facility], [address], [place], [place] [address]    Report Status 02/26/2020 FINAL  Final  C Difficile Quick Screen w PCR reflex     Status: None   Collection Time: 02/25/20 11:31 AM   Specimen: STOOL  Result Value Ref Range Status   C Diff antigen NEGATIVE NEGATIVE Final   C Diff toxin NEGATIVE NEGATIVE Final   C Diff interpretation No C. difficile detected.  Final    Comment: Performed at [facility], [address] [place], [place] [address]  Gastrointestinal Panel by PCR , Stool     Status: None   Collection Time: 02/25/20 11:31 AM   Specimen: STOOL  Result Value Ref Range Status   Campylobacter species NOT DETECTED NOT DETECTED Final   Plesimonas shigelloides NOT DETECTED NOT DETECTED Final   Salmonella species NOT DETECTED NOT DETECTED Final   Yersinia enterocolitica NOT DETECTED NOT DETECTED Final   Vibrio species NOT DETECTED NOT DETECTED Final   Vibrio cholerae NOT DETECTED NOT DETECTED Final   Enteroaggregative E coli (EAEC) NOT DETECTED NOT DETECTED Final   Enteropathogenic E coli (EPEC) NOT DETECTED NOT DETECTED Final   Enterotoxigenic E coli (ETEC) NOT DETECTED NOT DETECTED Final   Shiga like toxin producing E coli (STEC) NOT DETECTED NOT DETECTED Final   Shigella/Enteroinvasive E coli (EIEC) NOT DETECTED NOT DETECTED Final   Cryptosporidium NOT DETECTED NOT DETECTED Final   Cyclospora cayetanensis NOT DETECTED NOT DETECTED Final   Entamoeba histolytica NOT DETECTED NOT DETECTED Final   Giardia lamblia NOT DETECTED NOT DETECTED Final   Adenovirus F40/41 NOT DETECTED NOT DETECTED Final   Astrovirus  NOT DETECTED NOT DETECTED Final   Norovirus GI/GII NOT DETECTED NOT DETECTED Final   Rotavirus A NOT DETECTED NOT DETECTED Final   Sapovirus (I, II, IV, and V) NOT DETECTED NOT DETECTED Final    Comment: Performed at [facility], [address] [place], [place] [address]  Respiratory Panel by RT PCR (Flu A&B, Covid) - Nasopharyngeal Swab     Status: None   Collection Time: 02/25/20 11:31 AM   Specimen: Nasopharyngeal Swab  Result Value Ref Range Status   SARS Coronavirus 2 by RT PCR NEGATIVE NEGATIVE Final    Comment: (NOTE) SARS-CoV-2 target nucleic acids are NOT DETECTED.  The SARS-CoV-2 RNA is generally detectable in upper respiratoy specimens during the acute phase of infection. The lowest concentration of SARS-CoV-2 viral copies this assay can detect is 131 copies/mL. A negative result does not preclude SARS-Cov-2 infection and should not be used as the sole basis for treatment or other patient management decisions. A negative result may occur with  improper specimen collection/handling, submission of specimen other than nasopharyngeal swab, presence of viral mutation(s) within the areas targeted by this assay, and inadequate number of viral copies (<131 copies/mL). A negative result must be combined with clinical observations, patient history, and epidemiological information. The expected result is Negative.  Fact Sheet for Patients:  https://www.moore.com/  Fact Sheet for Healthcare Providers:  https://www.young.biz/  This test is no t yet approved or cleared by the [place] FDA and  has been authorized for detection and/or diagnosis of SARS-CoV-2 by FDA under an Emergency Use Authorization (EUA). This EUA will remain  in effect (meaning this test can be used) for the duration of the COVID-19 declaration under Section 564(b)(1) of the Act, 21 U.S.C. section 360bbb-3(b)(1), unless the authorization is terminated  or revoked  sooner.     Influenza A by PCR NEGATIVE NEGATIVE Final   Influenza B by PCR NEGATIVE NEGATIVE Final    Comment: (NOTE) The Xpert Xpress SARS-CoV-2/FLU/RSV assay is intended as an aid in  the diagnosis of influenza from Nasopharyngeal swab specimens and  should not be used as a sole basis for treatment. Nasal washings and  aspirates are unacceptable for Xpert Xpress SARS-CoV-2/FLU/RSV  testing.  Fact Sheet for Patients: https://www.moore.com/  Fact Sheet for Healthcare Providers: https://www.young.biz/  This test is not yet approved or cleared by the [place] FDA and  has been authorized for detection and/or diagnosis of SARS-CoV-2 by  FDA under an Emergency Use Authorization (EUA). This EUA will remain  in effect (meaning this test can be used) for the duration of the  Covid-19 declaration under Section 564(b)(1) of the Act, 21  U.S.C. section 360bbb-3(b)(1), unless the authorization is  terminated or revoked. Performed at [facility], [address] [place], [place] [address]   SARS Coronavirus 2 by RT PCR (hospital order, performed in [facility] hospital lab) Nasopharyngeal Nasopharyngeal Swab     Status: None   Collection Time: 02/28/20  5:12 PM   Specimen: Nasopharyngeal Swab  Result Value Ref Range Status   SARS Coronavirus 2 NEGATIVE NEGATIVE Final    Comment: (NOTE) SARS-CoV-2 target nucleic acids are NOT DETECTED.  The SARS-CoV-2 RNA is generally detectable in upper and lower respiratory specimens during the acute phase of infection. The lowest concentration of SARS-CoV-2 viral copies this assay can detect is 250 copies / mL. A negative result does not preclude SARS-CoV-2 infection and should not be used as the sole basis for treatment or other patient management decisions.  A negative result may occur with improper specimen collection / handling, submission of specimen other than nasopharyngeal  swab, presence of viral mutation(s) within the areas targeted by this assay, and inadequate number of viral copies (<250 copies / mL). A negative result must be combined with clinical observations, patient history, and epidemiological information.  Fact Sheet for Patients:   BoilerBrush.com.cy  Fact Sheet for Healthcare Providers: https://pope.com/  This test is not yet approved or  cleared by the [place] FDA and has been authorized for detection and/or diagnosis of SARS-CoV-2 by FDA under an Emergency Use Authorization (EUA).  This EUA will remain in effect (meaning this test can be used) for the duration of the COVID-19 declaration under Section 564(b)(1) of the Act, 21 U.S.C. section 360bbb-3(b)(1), unless the authorization is terminated or revoked sooner.  Performed at [facility], [address] [place], [place] [address]      Time coordinating discharge: Over 30 minutes  SIGNED:   Lynn Ito, MD  [facility] 02/29/2020, 9:25 AM Pager   If 7PM-7AM, please contact night-coverage [email or web address] Password [email or web address]

## 2020-02-29 NOTE — Progress Notes (Signed)
Mobility Specialist - Progress Note   02/29/20 1107  Mobility  Activity Transferred:  Bed to chair  Level of Assistance Moderate assist, patient does 50-74% (+2 assist)  Assistive Device Front wheel walker  Distance Ambulated (ft) 3 ft  Mobility Response Tolerated well  Mobility performed by Mobility specialist  $Mobility charge 1 Mobility    Pt was laying in bed upon arrival. Pt confused and HOH. Pt agreeable to session. Pt agreeable to transfer. Noted pt soiled her bed and gown. NT notified. Understands simple commands. Pt transitioned supine to sit w/ mod. Assist +1. Pt transferred from bed to chair mod. Assist, +2 assist for safety. Overall, pt tolerated session well. Pt left sitting on recliner w/ alarm set. Call bell and phone placed in reach. Nurse was notified via secure chat.     Deavon Podgorski Mobility Specialist  02/29/20, 11:14 AM

## 2020-02-29 NOTE — Progress Notes (Signed)
First attempt to call report to [facility]. no answer.

## 2020-05-05 DEATH — deceased
